# Patient Record
Sex: Female | Born: 1955 | Race: White | Hispanic: No | Marital: Married | Smoking: Never smoker
Health system: Southern US, Community
[De-identification: ages and names within clinical notes are randomized; demographics above are authoritative.]

## PROBLEM LIST (undated history)

## (undated) DIAGNOSIS — I10 Essential (primary) hypertension: Secondary | ICD-10-CM

## (undated) DIAGNOSIS — N842 Polyp of vagina: Secondary | ICD-10-CM

## (undated) DIAGNOSIS — T7840XA Allergy, unspecified, initial encounter: Secondary | ICD-10-CM

## (undated) DIAGNOSIS — B019 Varicella without complication: Secondary | ICD-10-CM

## (undated) DIAGNOSIS — K219 Gastro-esophageal reflux disease without esophagitis: Secondary | ICD-10-CM

## (undated) DIAGNOSIS — E785 Hyperlipidemia, unspecified: Secondary | ICD-10-CM

## (undated) HISTORY — DX: Varicella without complication: B01.9

## (undated) HISTORY — DX: Hyperlipidemia, unspecified: E78.5

## (undated) HISTORY — DX: Polyp of vagina: N84.2

## (undated) HISTORY — DX: Essential (primary) hypertension: I10

## (undated) HISTORY — DX: Gastro-esophageal reflux disease without esophagitis: K21.9

## (undated) HISTORY — DX: Allergy, unspecified, initial encounter: T78.40XA

## (undated) HISTORY — PX: INTRAUTERINE DEVICE INSERTION: SHX323

---

## 2004-06-28 ENCOUNTER — Ambulatory Visit: Payer: Self-pay | Admitting: Internal Medicine

## 2006-04-24 ENCOUNTER — Ambulatory Visit: Payer: Self-pay | Admitting: Internal Medicine

## 2007-05-15 ENCOUNTER — Ambulatory Visit: Payer: Self-pay | Admitting: Obstetrics and Gynecology

## 2008-06-23 ENCOUNTER — Ambulatory Visit: Payer: Self-pay | Admitting: Internal Medicine

## 2009-05-09 ENCOUNTER — Ambulatory Visit: Payer: Self-pay | Admitting: Gastroenterology

## 2009-07-05 ENCOUNTER — Ambulatory Visit: Payer: Self-pay | Admitting: Internal Medicine

## 2010-04-23 LAB — HM PAP SMEAR

## 2010-04-23 LAB — HM COLONOSCOPY

## 2010-04-23 LAB — HM MAMMOGRAPHY

## 2010-07-06 LAB — HM MAMMOGRAPHY: HM Mammogram: NORMAL

## 2010-08-02 ENCOUNTER — Ambulatory Visit: Payer: Self-pay | Admitting: Internal Medicine

## 2011-10-15 ENCOUNTER — Ambulatory Visit: Payer: Self-pay | Admitting: Internal Medicine

## 2011-11-05 ENCOUNTER — Ambulatory Visit (INDEPENDENT_AMBULATORY_CARE_PROVIDER_SITE_OTHER): Payer: BC Managed Care – PPO | Admitting: Internal Medicine

## 2011-11-05 ENCOUNTER — Encounter: Payer: Self-pay | Admitting: Internal Medicine

## 2011-11-05 VITALS — BP 140/90 | HR 76 | Temp 98.3°F | Ht 68.5 in | Wt 143.2 lb

## 2011-11-05 DIAGNOSIS — K219 Gastro-esophageal reflux disease without esophagitis: Secondary | ICD-10-CM

## 2011-11-05 DIAGNOSIS — E785 Hyperlipidemia, unspecified: Secondary | ICD-10-CM | POA: Insufficient documentation

## 2011-11-05 DIAGNOSIS — H9209 Otalgia, unspecified ear: Secondary | ICD-10-CM

## 2011-11-05 DIAGNOSIS — Z0000 Encounter for general adult medical examination without abnormal findings: Secondary | ICD-10-CM

## 2011-11-05 DIAGNOSIS — I10 Essential (primary) hypertension: Secondary | ICD-10-CM | POA: Insufficient documentation

## 2011-11-05 DIAGNOSIS — H9202 Otalgia, left ear: Secondary | ICD-10-CM | POA: Insufficient documentation

## 2011-11-05 DIAGNOSIS — Z23 Encounter for immunization: Secondary | ICD-10-CM

## 2011-11-05 NOTE — Progress Notes (Signed)
Subjective:    Patient ID: Natasha Pace, female    DOB: Jul 28, 1955, 56 y.o.   MRN: [id number]  HPI 56 year old female with history of hypertension and hyperlipidemia presents to establish care. Regards to her hypertension, she reports she was diagnosed over 10 years ago. She reports her blood pressure is generally been well controlled on metoprolol, however she doesn't regularly check her blood pressure.  She notes that she had elevated cholesterol on lab work performed approximately 6 months ago. However, this lab work was not performed fasting. She reports that her cholesterol has been elevated several times in the past. She tries to follow a healthy diet and exercises by walking approximately 3-4 miles per day.  She notes a history of GERD. She reports that recently her symptoms have been persistent despite the use of omeprazole daily. She has never had upper endoscopy. She has never been checked for H. pylori. She denies any bloody stools, nausea, vomiting, diarrhea. Symptoms consist of mostly upper epigastric pain.  She also notes an intermittent history of left ear pain. This was evaluated by ENT in the past and she was told this might be related to her acid reflux. She has not had any nasal congestion, fever, chills, hearing loss, dizziness. The pain is described as pressure which occurs intermittently lasting for a few days. Most recently occurred when she took a vacation to [place]. She did not use any medication for this.  Outpatient Encounter Prescriptions as of 11/05/2011  Medication Sig Dispense Refill  . metoprolol (LOPRESSOR) 50 MG tablet Take 50 mg by mouth daily.        Review of Systems  Constitutional: Negative for fever, chills, appetite change, fatigue and unexpected weight change.  HENT: Positive for ear pain. Negative for hearing loss, congestion, sore throat, trouble swallowing, neck pain, voice change, sinus pressure and ear discharge.   Eyes: Negative for visual  disturbance.  Respiratory: Negative for cough, shortness of breath, wheezing and stridor.   Cardiovascular: Negative for chest pain, palpitations and leg swelling.  Gastrointestinal: Positive for abdominal pain. Negative for nausea, vomiting, diarrhea, constipation, blood in stool, abdominal distention and anal bleeding.  Genitourinary: Negative for dysuria and flank pain.  Musculoskeletal: Negative for myalgias, arthralgias and gait problem.  Skin: Negative for color change and rash.  Neurological: Negative for dizziness and headaches.  Hematological: Negative for adenopathy. Does not bruise/bleed easily.  Psychiatric/Behavioral: Negative for suicidal ideas, disturbed wake/sleep cycle and dysphoric mood. The patient is not nervous/anxious.        Objective:   Physical Exam  Constitutional: She is oriented to person, place, and time. She appears well-developed and well-nourished. No distress.  HENT:  Head: Normocephalic and atraumatic.  Right Ear: External ear normal. Tympanic membrane is scarred.  Left Ear: External ear normal. Tympanic membrane is scarred.  Nose: Nose normal.  Mouth/Throat: Oropharynx is clear and moist. No oropharyngeal exudate.  Eyes: Conjunctivae are normal. Pupils are equal, round, and reactive to light. Right eye exhibits no discharge. Left eye exhibits no discharge. No scleral icterus.  Neck: Normal range of motion. Neck supple. No tracheal deviation present. No thyromegaly present.  Cardiovascular: Normal rate, regular rhythm, normal heart sounds and intact distal pulses.  Exam reveals no gallop and no friction rub.   No murmur heard. Pulmonary/Chest: Effort normal and breath sounds normal. No respiratory distress. She has no wheezes. She has no rales. She exhibits no tenderness.  Musculoskeletal: Normal range of motion. She exhibits no edema and no  tenderness.  Lymphadenopathy:    She has no cervical adenopathy.  Neurological: She is alert and oriented to  person, place, and time. No cranial nerve deficit. She exhibits normal muscle tone. Coordination normal.  Skin: Skin is warm and dry. No rash noted. She is not diaphoretic. No erythema. No pallor.  Psychiatric: She has a normal mood and affect. Her behavior is normal. Judgment and thought content normal.          Assessment & Plan:

## 2011-11-05 NOTE — Assessment & Plan Note (Signed)
Patient reports elevated lipids in the past. Will check lipids prior to physical exam in one month.

## 2011-11-05 NOTE — Assessment & Plan Note (Signed)
Blood pressure slightly elevated today. Will continue to monitor at home patient will e-mail or call with results. Will review previous evaluation and management. Question if patient ever had renal Dopplers given hypertension and young age. Followup one month.

## 2011-11-05 NOTE — Assessment & Plan Note (Signed)
Suspect related to increased middle ear pressure and poor equalization of pressure given scar tissue over the tympanic membrane. Discussed potential referral to ENT, but she would like to hold off for now. We'll continue to monitor.

## 2011-11-05 NOTE — Assessment & Plan Note (Signed)
Patient reports persistent reflux symptoms despite the use of omeprazole. Will try changing to Dexilant. We'll also check H. pylori with labs. Followup one month.

## 2011-11-07 ENCOUNTER — Encounter: Payer: Self-pay | Admitting: Internal Medicine

## 2011-11-07 ENCOUNTER — Other Ambulatory Visit (INDEPENDENT_AMBULATORY_CARE_PROVIDER_SITE_OTHER): Payer: BC Managed Care – PPO | Admitting: *Deleted

## 2011-11-07 DIAGNOSIS — Z0000 Encounter for general adult medical examination without abnormal findings: Secondary | ICD-10-CM

## 2011-11-07 DIAGNOSIS — K219 Gastro-esophageal reflux disease without esophagitis: Secondary | ICD-10-CM

## 2011-11-07 DIAGNOSIS — E785 Hyperlipidemia, unspecified: Secondary | ICD-10-CM

## 2011-11-07 LAB — LIPID PANEL: HDL: 58.3 mg/dL (ref >39.00)

## 2011-11-07 LAB — CBC WITH DIFFERENTIAL/PLATELET
Basophils Relative: 0.8 % (ref 0.0–3.0)
Eosinophils Relative: 4.5 % (ref 0.0–5.0)
MCV: 91.8 fl (ref 78.0–100.0)
Monocytes Absolute: 0.6 10*3/uL (ref 0.1–1.0)
Monocytes Relative: 9.5 % (ref 3.0–12.0)
Neutrophils Relative %: 58.2 % (ref 43.0–77.0)
RBC: 4.4 Mil/uL (ref 3.87–5.11)
WBC: 5.8 10*3/uL (ref 4.5–10.5)

## 2011-11-07 LAB — COMPREHENSIVE METABOLIC PANEL
ALT: 20 U/L (ref 0–35)
AST: 23 U/L (ref 0–37)
Albumin: 4.3 g/dL (ref 3.5–5.2)
Calcium: 9.4 mg/dL (ref 8.4–10.5)
Chloride: 102 mEq/L (ref 96–112)
Creatinine, Ser: 0.9 mg/dL (ref 0.4–1.2)
Potassium: 3.7 mEq/L (ref 3.5–5.1)

## 2011-11-07 LAB — LDL CHOLESTEROL, DIRECT: Direct LDL: 178.4 mg/dL

## 2011-11-08 MED ORDER — ATORVASTATIN CALCIUM 20 MG PO TABS: 20.0000 mg | ORAL_TABLET | Freq: Every day | ORAL | Status: DC

## 2011-11-09 LAB — HELICOBACTER PYLORI  ANTIBODY, IGM: Helicobacter pylori, IgM: 0.5 U/mL (ref <9.0)

## 2011-11-22 ENCOUNTER — Ambulatory Visit: Payer: Self-pay | Admitting: Internal Medicine

## 2011-11-23 ENCOUNTER — Encounter: Payer: Self-pay | Admitting: Internal Medicine

## 2011-11-24 MED ORDER — DEXLANSOPRAZOLE 60 MG PO CPDR: 60.0000 mg | DELAYED_RELEASE_CAPSULE | Freq: Every day | ORAL | Status: DC

## 2011-12-06 ENCOUNTER — Encounter: Payer: Self-pay | Admitting: Internal Medicine

## 2011-12-06 MED ORDER — METOPROLOL TARTRATE 50 MG PO TABS: 50.0000 mg | ORAL_TABLET | Freq: Every day | ORAL | Status: DC

## 2011-12-13 ENCOUNTER — Ambulatory Visit (INDEPENDENT_AMBULATORY_CARE_PROVIDER_SITE_OTHER): Payer: BC Managed Care – PPO | Admitting: Internal Medicine

## 2011-12-13 ENCOUNTER — Encounter: Payer: Self-pay | Admitting: Internal Medicine

## 2011-12-13 ENCOUNTER — Other Ambulatory Visit (HOSPITAL_COMMUNITY)
Admission: RE | Admit: 2011-12-13 | Discharge: 2011-12-13 | Disposition: A | Discharge status: Home / Self Care | Payer: BC Managed Care – PPO | Source: Ambulatory Visit | Attending: Internal Medicine | Admitting: Internal Medicine

## 2011-12-13 VITALS — BP 140/92 | HR 64 | Temp 98.7°F | Ht 68.5 in | Wt 141.0 lb

## 2011-12-13 DIAGNOSIS — K219 Gastro-esophageal reflux disease without esophagitis: Secondary | ICD-10-CM

## 2011-12-13 DIAGNOSIS — Z01419 Encounter for gynecological examination (general) (routine) without abnormal findings: Secondary | ICD-10-CM | POA: Insufficient documentation

## 2011-12-13 DIAGNOSIS — I10 Essential (primary) hypertension: Secondary | ICD-10-CM

## 2011-12-13 DIAGNOSIS — E785 Hyperlipidemia, unspecified: Secondary | ICD-10-CM

## 2011-12-13 DIAGNOSIS — Z0000 Encounter for general adult medical examination without abnormal findings: Secondary | ICD-10-CM

## 2011-12-13 MED ORDER — ESOMEPRAZOLE MAGNESIUM 40 MG PO CPDR: 40.0000 mg | DELAYED_RELEASE_CAPSULE | Freq: Every day | ORAL | Status: DC

## 2011-12-13 MED ORDER — METOPROLOL SUCCINATE ER 50 MG PO TB24: 50.0000 mg | ORAL_TABLET | Freq: Every day | ORAL | Status: DC

## 2011-12-13 NOTE — Assessment & Plan Note (Signed)
Patient recently started on atorvastatin given elevated cholesterol. Tolerating well. Will repeat liver function test and lipids today.

## 2011-12-13 NOTE — Assessment & Plan Note (Signed)
Symptoms improved with Dexilant, however insurance will not cover. Will try Nexium. Pt will call if symptoms not improved. If no improvement, favor repeat GI evaluation with upper endoscopy.

## 2011-12-13 NOTE — Assessment & Plan Note (Signed)
General medical exam including breast and pelvic exam normal today. Pap is pending. Patient is up-to-date on health maintenance. Encourage continued efforts at healthy diet and regular physical activity. Followup in 6 months.

## 2011-12-13 NOTE — Progress Notes (Signed)
Subjective:    Patient ID: Natasha Pace, female    DOB: 08/10/1955, 56 y.o.   MRN: [id number]  HPI 56 year old female with history of GERD presents for annual exam. At her last visit, she was started on Dexilant to help with acid reflux symptoms. She reports improvement with use of this medication, however her insurance company will not cover the medicine. She is back to using Prilosec but reports some breakthrough symptoms including epigastric pain. Testing for H. pylori was negative.  On her recent lab work, lipids were found to be elevated with an LDL of 178. We reviewed these labs today. We discussed goal LDL of less than 100. She was started on atorvastatin 20 mg a reports full compliance with this medication. She denies any noted side effects such as myalgia.    Outpatient Encounter Prescriptions as of 12/13/2011  Medication Sig Dispense Refill  . atorvastatin (LIPITOR) 20 MG tablet Take 1 tablet (20 mg total) by mouth daily.  60 tablet  3  . esomeprazole (NEXIUM) 40 MG capsule Take 1 capsule (40 mg total) by mouth daily.  30 capsule  3  . metoprolol succinate (TOPROL-XL) 50 MG 24 hr tablet Take 1 tablet (50 mg total) by mouth daily.  90 tablet  3   BP 140/92  Pulse 64  Temp 98.7 F (37.1 C) (Oral)  Ht 5' 8.5" (1.74 m)  Wt 141 lb (63.957 kg)  BMI 21.13 kg/m2  SpO2 96%  Review of Systems  Constitutional: Negative for fever, chills, appetite change, fatigue and unexpected weight change.  HENT: Negative for ear pain, congestion, sore throat, trouble swallowing, neck pain, voice change and sinus pressure.   Eyes: Negative for visual disturbance.  Respiratory: Negative for cough, shortness of breath, wheezing and stridor.   Cardiovascular: Negative for chest pain, palpitations and leg swelling.  Gastrointestinal: Negative for nausea, vomiting, abdominal pain, diarrhea, constipation, blood in stool, abdominal distention and anal bleeding.  Genitourinary: Negative for dysuria and  flank pain.  Musculoskeletal: Negative for myalgias, arthralgias and gait problem.  Skin: Negative for color change and rash.  Neurological: Negative for dizziness and headaches.  Hematological: Negative for adenopathy. Does not bruise/bleed easily.  Psychiatric/Behavioral: Negative for suicidal ideas, disturbed wake/sleep cycle and dysphoric mood. The patient is not nervous/anxious.        Objective:   Physical Exam  Constitutional: She is oriented to person, place, and time. She appears well-developed and well-nourished. No distress.  HENT:  Head: Normocephalic and atraumatic.  Right Ear: External ear normal.  Left Ear: External ear normal.  Nose: Nose normal.  Mouth/Throat: Oropharynx is clear and moist. No oropharyngeal exudate.  Eyes: Conjunctivae are normal. Pupils are equal, round, and reactive to light. Right eye exhibits no discharge. Left eye exhibits no discharge. No scleral icterus.  Neck: Normal range of motion. Neck supple. No tracheal deviation present. No thyromegaly present.  Cardiovascular: Normal rate, regular rhythm, normal heart sounds and intact distal pulses.  Exam reveals no gallop and no friction rub.   No murmur heard. Pulmonary/Chest: Effort normal and breath sounds normal. No respiratory distress. She has no wheezes. She has no rales. She exhibits no tenderness.  Abdominal: Soft. Bowel sounds are normal. She exhibits no distension and no mass. There is no tenderness. There is no rebound and no guarding.  Genitourinary: Rectum normal, vagina normal and uterus normal. No breast swelling, tenderness, discharge or bleeding. Pelvic exam was performed with patient supine. There is no rash, tenderness or lesion on  the right labia. There is no rash, tenderness or lesion on the left labia. Uterus is not enlarged and not tender. Cervix exhibits no motion tenderness, no discharge and no friability. Right adnexum displays no mass, no tenderness and no fullness. Left adnexum  displays no mass, no tenderness and no fullness. No erythema or tenderness around the vagina. No vaginal discharge found.  Musculoskeletal: Normal range of motion. She exhibits no edema and no tenderness.  Lymphadenopathy:    She has no cervical adenopathy.  Neurological: She is alert and oriented to person, place, and time. No cranial nerve deficit. She exhibits normal muscle tone. Coordination normal.  Skin: Skin is warm and dry. No rash noted. She is not diaphoretic. No erythema. No pallor.  Psychiatric: She has a normal mood and affect. Her behavior is normal. Judgment and thought content normal.          Assessment & Plan:

## 2011-12-13 NOTE — Assessment & Plan Note (Signed)
Blood pressure slightly elevated but patient reports better control at home. We'll continue Toprol. Patient will call if blood pressure consistently greater than 140/90. Followup 6 months.

## 2012-03-05 LAB — HM PAP SMEAR: HM PAP: NORMAL

## 2012-04-04 ENCOUNTER — Encounter: Payer: Self-pay | Admitting: Internal Medicine

## 2012-04-04 ENCOUNTER — Other Ambulatory Visit: Payer: Self-pay | Admitting: General Practice

## 2012-04-04 DIAGNOSIS — K219 Gastro-esophageal reflux disease without esophagitis: Secondary | ICD-10-CM

## 2012-04-04 MED ORDER — ESOMEPRAZOLE MAGNESIUM 40 MG PO CPDR: 40.0000 mg | DELAYED_RELEASE_CAPSULE | Freq: Every day | ORAL | Status: DC

## 2012-04-14 ENCOUNTER — Telehealth: Payer: Self-pay | Admitting: Internal Medicine

## 2012-04-14 NOTE — Telephone Encounter (Signed)
PA form received sent to Dr. Dan Humphreys to fill out.

## 2012-04-14 NOTE — Telephone Encounter (Signed)
Prior authorization for nexium dr 40 mg capsule  Qty: 30 each Sig: take one capsule by mouth daily I called to get the form faxed to me the number is [phone] The patient's ID is [id number] I spoke with [facility] she is going to fax form over to [place].

## 2012-04-15 NOTE — Telephone Encounter (Signed)
PA form faxed in insurance company, for the second time.

## 2012-04-21 ENCOUNTER — Other Ambulatory Visit: Payer: Self-pay | Admitting: General Practice

## 2012-04-21 DIAGNOSIS — K219 Gastro-esophageal reflux disease without esophagitis: Secondary | ICD-10-CM

## 2012-04-21 MED ORDER — ESOMEPRAZOLE MAGNESIUM 40 MG PO CPDR: 40.0000 mg | DELAYED_RELEASE_CAPSULE | Freq: Every day | ORAL | Status: DC

## 2012-04-21 NOTE — Telephone Encounter (Signed)
I called insurance company and spoke with Arline Asp she states that this has been approved for 1 year expiring on 04/15/13 the case # is [id number] for qty of 30.  She states that we need to contact [facility] and have them rerun the claim so that they can see patient has been approved.

## 2012-04-21 NOTE — Telephone Encounter (Signed)
Medication reordered on 12/30.

## 2012-04-23 DIAGNOSIS — N842 Polyp of vagina: Secondary | ICD-10-CM

## 2012-04-23 HISTORY — DX: Polyp of vagina: N84.2

## 2012-05-21 ENCOUNTER — Encounter: Payer: Self-pay | Admitting: Internal Medicine

## 2012-06-07 ENCOUNTER — Other Ambulatory Visit: Payer: Self-pay

## 2012-07-01 ENCOUNTER — Other Ambulatory Visit: Payer: Self-pay | Admitting: Internal Medicine

## 2012-07-03 NOTE — Telephone Encounter (Signed)
Eprescribed.

## 2012-07-06 ENCOUNTER — Other Ambulatory Visit: Payer: Self-pay | Admitting: Internal Medicine

## 2012-08-13 ENCOUNTER — Encounter: Payer: Self-pay | Admitting: Internal Medicine

## 2012-08-13 MED ORDER — ATORVASTATIN CALCIUM 20 MG PO TABS: 20.0000 mg | ORAL_TABLET | Freq: Every day | ORAL | Status: DC

## 2012-08-23 ENCOUNTER — Other Ambulatory Visit: Payer: Self-pay | Admitting: Internal Medicine

## 2012-12-22 ENCOUNTER — Other Ambulatory Visit: Payer: Self-pay | Admitting: Internal Medicine

## 2012-12-23 NOTE — Telephone Encounter (Signed)
Eprescribed.

## 2012-12-25 ENCOUNTER — Other Ambulatory Visit: Payer: Self-pay | Admitting: Internal Medicine

## 2013-02-10 ENCOUNTER — Ambulatory Visit (INDEPENDENT_AMBULATORY_CARE_PROVIDER_SITE_OTHER): Payer: BC Managed Care – PPO | Admitting: Internal Medicine

## 2013-02-10 ENCOUNTER — Encounter: Payer: Self-pay | Admitting: Internal Medicine

## 2013-02-10 VITALS — BP 128/90 | HR 62 | Temp 98.3°F | Ht 68.5 in | Wt 149.0 lb

## 2013-02-10 DIAGNOSIS — K219 Gastro-esophageal reflux disease without esophagitis: Secondary | ICD-10-CM

## 2013-02-10 DIAGNOSIS — N952 Postmenopausal atrophic vaginitis: Secondary | ICD-10-CM

## 2013-02-10 DIAGNOSIS — E785 Hyperlipidemia, unspecified: Secondary | ICD-10-CM

## 2013-02-10 DIAGNOSIS — I10 Essential (primary) hypertension: Secondary | ICD-10-CM

## 2013-02-10 DIAGNOSIS — Z0000 Encounter for general adult medical examination without abnormal findings: Secondary | ICD-10-CM

## 2013-02-10 MED ORDER — METOPROLOL SUCCINATE ER 50 MG PO TB24: 50.0000 mg | ORAL_TABLET | Freq: Every day | ORAL | Status: DC

## 2013-02-10 MED ORDER — ESTROGENS, CONJUGATED 0.625 MG/GM VA CREA: TOPICAL_CREAM | VAGINAL | Status: DC

## 2013-02-10 MED ORDER — ESOMEPRAZOLE MAGNESIUM 40 MG PO CPDR: 40.0000 mg | DELAYED_RELEASE_CAPSULE | Freq: Every day | ORAL | Status: DC

## 2013-02-10 NOTE — Assessment & Plan Note (Signed)
General medical exam including breast exam normal today. PAP and pelvic deferred as normal 2013. Mammogram ordered. Colonoscopy UTD. Will check labs including CBC, CMP, lipids, TSH, Vit D. Encouraged healthy diet and regular physical activity.

## 2013-02-10 NOTE — Assessment & Plan Note (Signed)
Will check lipids and LFTs with labs. 

## 2013-02-10 NOTE — Assessment & Plan Note (Signed)
Symptoms well controlled with Nexium. Will continue. 

## 2013-02-10 NOTE — Assessment & Plan Note (Signed)
BP Readings from Last 3 Encounters:  02/10/13 128/90  12/13/11 140/92  11/05/11 140/90   BP generally well controlled on Metoprolol. Will continue.

## 2013-02-10 NOTE — Progress Notes (Signed)
Subjective:    Patient ID: Natasha Pace, female    DOB: Jan 04, 1956, 57 y.o.   MRN: [id number]  HPI 57 year old female with history of hypertension, hyperlipidemia presents for annual exam. She reports she is generally feeling well. She notes that, earlier this year, she had her IUD removed. At that time, her GYN noticed that she had a vaginal polyp. This was removed and biopsied. This showed benign tissue.  She is concerned today about recent increase in vaginal dryness. She notes some discomfort with intercourse. She has never taken medications for this. She has been researching about topical estrogen preparations would like to consider this.  Outpatient Encounter Prescriptions as of 02/10/2013  Medication Sig Dispense Refill  . atorvastatin (LIPITOR) 20 MG tablet Take 1 tablet (20 mg total) by mouth daily.  90 tablet  3  . esomeprazole (NEXIUM) 40 MG capsule Take 1 capsule (40 mg total) by mouth daily.  90 capsule  3  . loratadine (CLARITIN) 10 MG tablet Take 10 mg by mouth daily.      . metoprolol succinate (TOPROL-XL) 50 MG 24 hr tablet Take 1 tablet (50 mg total) by mouth daily.  90 tablet  3   No facility-administered encounter medications on file as of 02/10/2013.   BP 128/90  Pulse 62  Temp(Src) 98.3 F (36.8 C) (Oral)  Ht 5' 8.5" (1.74 m)  Wt 149 lb (67.586 kg)  BMI 22.32 kg/m2  SpO2 99%  Review of Systems  Constitutional: Negative for fever, chills, appetite change, fatigue and unexpected weight change.  HENT: Negative for congestion, ear pain, sinus pressure, sore throat, trouble swallowing and voice change.   Eyes: Negative for visual disturbance.  Respiratory: Negative for cough, shortness of breath, wheezing and stridor.   Cardiovascular: Negative for chest pain, palpitations and leg swelling.  Gastrointestinal: Negative for nausea, vomiting, abdominal pain, diarrhea, constipation, blood in stool, abdominal distention and anal bleeding.  Genitourinary: Positive for  vaginal pain (dryness) and dyspareunia. Negative for dysuria, flank pain, vaginal bleeding and vaginal discharge.  Musculoskeletal: Negative for arthralgias, gait problem, myalgias and neck pain.  Skin: Negative for color change and rash.  Neurological: Negative for dizziness and headaches.  Hematological: Negative for adenopathy. Does not bruise/bleed easily.  Psychiatric/Behavioral: Negative for suicidal ideas, sleep disturbance and dysphoric mood. The patient is not nervous/anxious.        Objective:   Physical Exam  Constitutional: She is oriented to person, place, and time. She appears well-developed and well-nourished. No distress.  HENT:  Head: Normocephalic and atraumatic.  Right Ear: External ear normal.  Left Ear: External ear normal.  Nose: Nose normal.  Mouth/Throat: Oropharynx is clear and moist. No oropharyngeal exudate.  Eyes: Conjunctivae are normal. Pupils are equal, round, and reactive to light. Right eye exhibits no discharge. Left eye exhibits no discharge. No scleral icterus.  Neck: Normal range of motion. Neck supple. No tracheal deviation present. No thyromegaly present.  Cardiovascular: Normal rate, regular rhythm, normal heart sounds and intact distal pulses.  Exam reveals no gallop and no friction rub.   No murmur heard. Pulmonary/Chest: Effort normal and breath sounds normal. No accessory muscle usage. Not tachypneic. No respiratory distress. She has no decreased breath sounds. She has no wheezes. She has no rales. She exhibits no tenderness. Right breast exhibits no inverted nipple, no mass, no nipple discharge, no skin change and no tenderness. Left breast exhibits no inverted nipple, no mass, no nipple discharge, no skin change and no tenderness. Breasts are  symmetrical.  Abdominal: Soft. Bowel sounds are normal. She exhibits no distension and no mass. There is no tenderness. There is no rebound and no guarding.  Musculoskeletal: Normal range of motion. She  exhibits no edema and no tenderness.  Lymphadenopathy:    She has no cervical adenopathy.  Neurological: She is alert and oriented to person, place, and time. No cranial nerve deficit. She exhibits normal muscle tone. Coordination normal.  Skin: Skin is warm and dry. No rash noted. She is not diaphoretic. No erythema. No pallor.  Psychiatric: She has a normal mood and affect. Her behavior is normal. Judgment and thought content normal.          Assessment & Plan:

## 2013-02-10 NOTE — Assessment & Plan Note (Signed)
Vaginal dryness and dyspareunia. Discussed options for treatment including topical estrogen. Discussed potential risks of these medications. Will start Premarin topically daily x 2 weeks, then 1-2 times weekly. Follow up prn.

## 2013-02-11 LAB — CBC WITH DIFFERENTIAL/PLATELET
Basophils Absolute: 0.1 10*3/uL (ref 0.0–0.1)
Basophils Relative: 1.7 % (ref 0.0–3.0)
Eosinophils Relative: 3 % (ref 0.0–5.0)
HCT: 38.2 % (ref 36.0–46.0)
Hemoglobin: 13.1 g/dL (ref 12.0–15.0)
Lymphocytes Relative: 36.5 % (ref 12.0–46.0)
Lymphs Abs: 2.2 10*3/uL (ref 0.7–4.0)
MCV: 89.3 fl (ref 78.0–100.0)
Monocytes Relative: 7.8 % (ref 3.0–12.0)
Neutro Abs: 3.1 10*3/uL (ref 1.4–7.7)
Platelets: 228 10*3/uL (ref 150.0–400.0)
RBC: 4.28 Mil/uL (ref 3.87–5.11)
WBC: 6 10*3/uL (ref 4.5–10.5)

## 2013-02-11 LAB — COMPREHENSIVE METABOLIC PANEL
ALT: 30 U/L (ref 0–35)
Albumin: 4.3 g/dL (ref 3.5–5.2)
CO2: 30 mEq/L (ref 19–32)
Calcium: 9.6 mg/dL (ref 8.4–10.5)
Chloride: 102 mEq/L (ref 96–112)
GFR: 79.11 mL/min (ref >60.00)
Glucose, Bld: 91 mg/dL (ref 70–99)
Potassium: 4.2 mEq/L (ref 3.5–5.1)
Sodium: 139 mEq/L (ref 135–145)
Total Bilirubin: 0.9 mg/dL (ref 0.3–1.2)
Total Protein: 7.6 g/dL (ref 6.0–8.3)

## 2013-02-11 LAB — LIPID PANEL
Cholesterol: 182 mg/dL (ref 0–200)
LDL Cholesterol: 93 mg/dL (ref 0–99)
Total CHOL/HDL Ratio: 3

## 2013-02-11 LAB — TSH: TSH: 0.92 u[IU]/mL (ref 0.35–5.50)

## 2013-03-10 ENCOUNTER — Ambulatory Visit: Payer: Self-pay | Admitting: Internal Medicine

## 2013-03-10 LAB — HM MAMMOGRAPHY

## 2013-03-30 ENCOUNTER — Encounter: Payer: Self-pay | Admitting: Internal Medicine

## 2013-05-28 ENCOUNTER — Telehealth: Payer: Self-pay | Admitting: *Deleted

## 2013-05-28 NOTE — Telephone Encounter (Signed)
Received PA request form for the Nexium, placed in Dr.walkers box

## 2013-06-01 NOTE — Telephone Encounter (Signed)
Left message for patient to return call. Dr. Dan HumphreysWalker would like to know what other medications she has tried other than Nexium. Need this information for completion of PA

## 2013-06-03 NOTE — Telephone Encounter (Signed)
Left message to call back  

## 2013-06-08 ENCOUNTER — Ambulatory Visit: Payer: BC Managed Care – PPO | Admitting: Adult Health

## 2013-06-08 NOTE — Telephone Encounter (Signed)
Spoke with patient, she stated she has tried Omeprazole in the past but the Nexium works better. However since Nexium is available OTC then she will just pay for it herself. Since insurance company is trying to deny it then it is easier for her to buy it OTC.

## 2013-08-18 ENCOUNTER — Other Ambulatory Visit: Payer: Self-pay | Admitting: Internal Medicine

## 2014-02-11 ENCOUNTER — Other Ambulatory Visit: Payer: Self-pay | Admitting: Internal Medicine

## 2014-02-26 ENCOUNTER — Other Ambulatory Visit: Payer: Self-pay | Admitting: Internal Medicine

## 2014-03-05 ENCOUNTER — Ambulatory Visit (INDEPENDENT_AMBULATORY_CARE_PROVIDER_SITE_OTHER): Payer: BC Managed Care – PPO | Admitting: Internal Medicine

## 2014-03-05 ENCOUNTER — Encounter: Payer: Self-pay | Admitting: Internal Medicine

## 2014-03-05 VITALS — BP 130/78 | HR 65 | Temp 98.1°F | Ht 68.5 in | Wt 149.2 lb

## 2014-03-05 DIAGNOSIS — Z0000 Encounter for general adult medical examination without abnormal findings: Secondary | ICD-10-CM

## 2014-03-05 MED ORDER — PANTOPRAZOLE SODIUM 40 MG PO TBEC: 40.0000 mg | DELAYED_RELEASE_TABLET | Freq: Every day | ORAL | Status: DC

## 2014-03-05 NOTE — Patient Instructions (Signed)

## 2014-03-05 NOTE — Progress Notes (Signed)
Pre visit review using our clinic review tool, if applicable. No additional management support is needed unless otherwise documented below in the visit note. 

## 2014-03-05 NOTE — Assessment & Plan Note (Signed)
General medical exam normal including breast exam. PAP and pelvic deferred given PAP normal 2013. Mammogram ordered. Colonoscopy UTD. Flu vaccine complete. Will check labs including CBC, CMP, lipids, TSH. Follow up in 1 year and prn.

## 2014-03-05 NOTE — Progress Notes (Signed)
Subjective:    Patient ID: Natasha Pace, female    DOB: [birth year], 58 y.o.   MRN: [id number]  HPI 58YO female presents for annual exam. Feeling well with no concerns today. Recently had pulled muscle in her left shoulder. Improved with taking ibuprofen. Follows healthy diet and exercises by walking about 4-745miles daily and doing yoga twice per week. Had flu shot this year Due for mammogram.  Review of Systems  Constitutional: Negative for fever, chills, appetite change, fatigue and unexpected weight change.  Eyes: Negative for visual disturbance.  Respiratory: Negative for shortness of breath.   Cardiovascular: Negative for chest pain and leg swelling.  Gastrointestinal: Negative for nausea, vomiting, abdominal pain, diarrhea and constipation.  Musculoskeletal: Positive for myalgias. Negative for arthralgias.  Skin: Negative for color change and rash.  Hematological: Negative for adenopathy. Does not bruise/bleed easily.  Psychiatric/Behavioral: Negative for dysphoric mood. The patient is not nervous/anxious.        Objective:    BP 130/78 mmHg  Pulse 65  Temp(Src) 98.1 F (36.7 C) (Oral)  Ht 5' 8.5" (1.74 m)  Wt 149 lb 4 oz (67.699 kg)  BMI 22.36 kg/m2  SpO2 94% Physical Exam  Constitutional: She is oriented to person, place, and time. She appears well-developed and well-nourished. No distress.  HENT:  Head: Normocephalic and atraumatic.  Right Ear: External ear normal.  Left Ear: External ear normal.  Nose: Nose normal.  Mouth/Throat: Oropharynx is clear and moist. No oropharyngeal exudate.  Eyes: Conjunctivae are normal. Pupils are equal, round, and reactive to light. Right eye exhibits no discharge. Left eye exhibits no discharge. No scleral icterus.  Neck: Normal range of motion. Neck supple. No tracheal deviation present. No thyromegaly present.  Cardiovascular: Normal rate, regular rhythm, normal heart sounds and intact distal pulses.  Exam reveals no gallop  and no friction rub.   No murmur heard. Pulmonary/Chest: Effort normal and breath sounds normal. No accessory muscle usage. No tachypnea. No respiratory distress. She has no decreased breath sounds. She has no wheezes. She has no rales. She exhibits no tenderness. Right breast exhibits no inverted nipple, no mass, no nipple discharge, no skin change and no tenderness. Left breast exhibits no inverted nipple, no mass, no nipple discharge, no skin change and no tenderness. Breasts are symmetrical.  Abdominal: Soft. Bowel sounds are normal. She exhibits no distension and no mass. There is no tenderness. There is no rebound and no guarding.  Musculoskeletal: Normal range of motion. She exhibits no edema or tenderness.  Lymphadenopathy:    She has no cervical adenopathy.  Neurological: She is alert and oriented to person, place, and time. No cranial nerve deficit. She exhibits normal muscle tone. Coordination normal.  Skin: Skin is warm and dry. No rash noted. She is not diaphoretic. No erythema. No pallor.  Psychiatric: She has a normal mood and affect. Her behavior is normal. Judgment and thought content normal.          Assessment & Plan:   Problem List Items Addressed This Visit      Unprioritized   Routine general medical examination at a health care facility - Primary    General medical exam normal including breast exam. PAP and pelvic deferred given PAP normal 2013. Mammogram ordered. Colonoscopy UTD. Flu vaccine complete. Will check labs including CBC, CMP, lipids, TSH. Follow up in 1 year and prn.    Relevant Orders      MM Digital Screening      CBC  with Differential      Comprehensive metabolic panel      Lipid panel      Vit D  25 hydroxy (rtn osteoporosis monitoring)      TSH       Return in about 1 year (around 03/06/2015) for Physical.

## 2014-03-06 LAB — VITAMIN D 25 HYDROXY (VIT D DEFICIENCY, FRACTURES): Vit D, 25-Hydroxy: 44 ng/mL (ref 30–89)

## 2014-03-06 LAB — LIPID PANEL
CHOLESTEROL: 205 mg/dL — AB (ref 0–200)
HDL: 59 mg/dL (ref >39)
LDL CALC: 98 mg/dL (ref 0–99)
TRIGLYCERIDES: 240 mg/dL — AB (ref <150)
Total CHOL/HDL Ratio: 3.5 Ratio
VLDL: 48 mg/dL — ABNORMAL HIGH (ref 0–40)

## 2014-03-06 LAB — COMPREHENSIVE METABOLIC PANEL
ALK PHOS: 73 U/L (ref 39–117)
ALT: 33 U/L (ref 0–35)
AST: 29 U/L (ref 0–37)
Albumin: 4.6 g/dL (ref 3.5–5.2)
BILIRUBIN TOTAL: 0.7 mg/dL (ref 0.2–1.2)
BUN: 16 mg/dL (ref 6–23)
CO2: 29 meq/L (ref 19–32)
Calcium: 9.6 mg/dL (ref 8.4–10.5)
Chloride: 102 mEq/L (ref 96–112)
Creat: 0.79 mg/dL (ref 0.50–1.10)
GLUCOSE: 82 mg/dL (ref 70–99)
Potassium: 4.1 mEq/L (ref 3.5–5.3)
Sodium: 140 mEq/L (ref 135–145)
TOTAL PROTEIN: 7.9 g/dL (ref 6.0–8.3)

## 2014-03-06 LAB — CBC WITH DIFFERENTIAL/PLATELET
BASOS PCT: 0 % (ref 0–1)
Basophils Absolute: 0 10*3/uL (ref 0.0–0.1)
EOS ABS: 0.3 10*3/uL (ref 0.0–0.7)
Eosinophils Relative: 4 % (ref 0–5)
HEMATOCRIT: 40.7 % (ref 36.0–46.0)
HEMOGLOBIN: 14.3 g/dL (ref 12.0–15.0)
Lymphocytes Relative: 33 % (ref 12–46)
Lymphs Abs: 2.4 10*3/uL (ref 0.7–4.0)
MCH: 30.1 pg (ref 26.0–34.0)
MCHC: 35.1 g/dL (ref 30.0–36.0)
MCV: 85.7 fL (ref 78.0–100.0)
MONO ABS: 0.7 10*3/uL (ref 0.1–1.0)
MONOS PCT: 9 % (ref 3–12)
Neutro Abs: 4 10*3/uL (ref 1.7–7.7)
Neutrophils Relative %: 54 % (ref 43–77)
Platelets: 262 10*3/uL (ref 150–400)
RBC: 4.75 MIL/uL (ref 3.87–5.11)
RDW: 14.1 % (ref 11.5–15.5)
WBC: 7.4 10*3/uL (ref 4.0–10.5)

## 2014-03-06 LAB — TSH: TSH: 1.136 u[IU]/mL (ref 0.350–4.500)

## 2014-04-05 ENCOUNTER — Encounter: Payer: Self-pay | Admitting: Internal Medicine

## 2014-04-19 ENCOUNTER — Ambulatory Visit: Payer: Self-pay | Admitting: Internal Medicine

## 2014-04-19 LAB — HM MAMMOGRAPHY: HM Mammogram: NEGATIVE

## 2014-06-21 ENCOUNTER — Telehealth: Payer: Self-pay

## 2014-06-21 NOTE — Telephone Encounter (Signed)
PA request for Pantoprazole has been submitted through cover my meds. Awaiting response at this time.

## 2014-08-07 ENCOUNTER — Other Ambulatory Visit: Payer: Self-pay | Admitting: Internal Medicine

## 2014-09-15 ENCOUNTER — Other Ambulatory Visit: Payer: Self-pay | Admitting: Internal Medicine

## 2015-01-26 ENCOUNTER — Ambulatory Visit (INDEPENDENT_AMBULATORY_CARE_PROVIDER_SITE_OTHER): Payer: BC Managed Care – PPO | Admitting: Family Medicine

## 2015-01-26 ENCOUNTER — Encounter: Payer: Self-pay | Admitting: Family Medicine

## 2015-01-26 VITALS — BP 140/96 | HR 67 | Temp 97.7°F | Ht 68.5 in | Wt 150.4 lb

## 2015-01-26 DIAGNOSIS — M6248 Contracture of muscle, other site: Secondary | ICD-10-CM | POA: Diagnosis not present

## 2015-01-26 DIAGNOSIS — M62838 Other muscle spasm: Secondary | ICD-10-CM

## 2015-01-26 MED ORDER — CYCLOBENZAPRINE HCL 5 MG PO TABS: 5.0000 mg | ORAL_TABLET | Freq: Three times a day (TID) | ORAL | Status: DC | PRN

## 2015-01-26 NOTE — Assessment & Plan Note (Signed)
Exam consistent with trapezius spasm. Treating with flexeril PRN. Also advised massage.

## 2015-01-26 NOTE — Progress Notes (Signed)
Pre visit review using our clinic review tool, if applicable. No additional management support is needed unless otherwise documented below in the visit note. 

## 2015-01-26 NOTE — Patient Instructions (Signed)
It was nice to see you today.   Take the flexeril as needed (start at night).  I also recommend massage.  Follow up with Dr. Dan Humphreys   Take care  Dr. Adriana Simas

## 2015-01-26 NOTE — Progress Notes (Signed)
   Subjective:  Patient ID: Natasha Pace, female    DOB: Oct 12, 1955  Age: 59 y.o. MRN: [id number]  CC: Neck/shoulder pain  HPI:  59 year old female presents to the clinic today with complaints of neck/shoulder pain.  Patient reports she has had pain in her left neck/shoulder region intermittently for the past year. She describes the pain as achy and throbbing. She reports it is intermittently severe (6-7/10). She's been taking Advil regularly (800 mg Q6) which does improve her pain.  Exacerbating factors: Looking down at work.  No associated numbness or tingling in the hands. No other complaints at this time.  Social Hx   Social History   Social History  . Marital Status: Married    Spouse Name: N/A  . Number of Children: N/A  . Years of Education: N/A   Social History Main Topics  . Smoking status: Never Smoker   . Smokeless tobacco: None  . Alcohol Use: None  . Drug Use: None  . Sexual Activity: Not Asked   Other Topics Concern  . None   Social History Narrative   Lives in [place] with husband. 2 girls in college.      Work - Surveyor, mining at [employer]   Diet - healthy   Exercise - walks 3-4 miles per day   Review of Systems  Constitutional: Negative.   Musculoskeletal: Positive for myalgias and neck pain.   Objective:  BP 140/96 mmHg  Pulse 67  Temp(Src) 97.7 F (36.5 C) (Oral)  Ht 5' 8.5" (1.74 m)  Wt 150 lb 6 oz (68.21 kg)  BMI 22.53 kg/m2  SpO2 96%  BP/Weight 01/26/2015 03/05/2014 02/10/2013  Systolic BP 140 130 128  Diastolic BP 96 78 90  Wt. (Lbs) 150.38 149.25 149  BMI 22.53 22.36 22.32   Physical Exam  Constitutional: She is oriented to person, place, and time. She appears well-developed and well-nourished. No distress.  Musculoskeletal:  Neck: Normal ROM. Negative Spurlings. Shoulder: Inspection reveals no abnormalities, atrophy or asymmetry. Palpation is normal with no tenderness over AC joint or bicipital groove. ROM is full in all  planes. Rotator cuff strength normal throughout. No signs of impingement Left Trapezius muscle tender to palpation; tense/ropy.   Neurological: She is alert and oriented to person, place, and time.  Skin: Skin is warm and dry. No rash noted.  Psychiatric: She has a normal mood and affect.   Assessment & Plan:   Problem List Items Addressed This Visit    Trapezius muscle spasm - Primary     Meds ordered this encounter  Medications  . cyclobenzaprine (FLEXERIL) 5 MG tablet    Sig: Take 1 tablet (5 mg total) by mouth 3 (three) times daily as needed for muscle spasms.    Dispense:  30 tablet    Refill:  1    Follow-up: PRN  Everlene Other, DO

## 2015-02-04 ENCOUNTER — Other Ambulatory Visit: Payer: Self-pay | Admitting: Internal Medicine

## 2015-02-23 ENCOUNTER — Other Ambulatory Visit: Payer: Self-pay | Admitting: Internal Medicine

## 2015-03-21 ENCOUNTER — Ambulatory Visit: Payer: BC Managed Care – PPO | Admitting: Internal Medicine

## 2015-04-11 ENCOUNTER — Other Ambulatory Visit (HOSPITAL_COMMUNITY)
Admission: RE | Admit: 2015-04-11 | Discharge: 2015-04-11 | Disposition: A | Discharge status: Home / Self Care | Payer: BC Managed Care – PPO | Source: Ambulatory Visit | Attending: Internal Medicine | Admitting: Internal Medicine

## 2015-04-11 ENCOUNTER — Encounter: Payer: Self-pay | Admitting: *Deleted

## 2015-04-11 ENCOUNTER — Encounter: Payer: Self-pay | Admitting: Internal Medicine

## 2015-04-11 ENCOUNTER — Ambulatory Visit (INDEPENDENT_AMBULATORY_CARE_PROVIDER_SITE_OTHER): Payer: BC Managed Care – PPO | Admitting: Internal Medicine

## 2015-04-11 VITALS — BP 118/80 | HR 73 | Temp 98.5°F | Resp 18 | Ht 68.0 in | Wt 147.5 lb

## 2015-04-11 DIAGNOSIS — Z0000 Encounter for general adult medical examination without abnormal findings: Secondary | ICD-10-CM | POA: Diagnosis not present

## 2015-04-11 DIAGNOSIS — Z1239 Encounter for other screening for malignant neoplasm of breast: Secondary | ICD-10-CM

## 2015-04-11 DIAGNOSIS — R7989 Other specified abnormal findings of blood chemistry: Secondary | ICD-10-CM

## 2015-04-11 DIAGNOSIS — Z23 Encounter for immunization: Secondary | ICD-10-CM

## 2015-04-11 DIAGNOSIS — Z124 Encounter for screening for malignant neoplasm of cervix: Secondary | ICD-10-CM

## 2015-04-11 DIAGNOSIS — Z1151 Encounter for screening for human papillomavirus (HPV): Secondary | ICD-10-CM | POA: Diagnosis present

## 2015-04-11 DIAGNOSIS — Z01419 Encounter for gynecological examination (general) (routine) without abnormal findings: Secondary | ICD-10-CM | POA: Diagnosis present

## 2015-04-11 LAB — CBC WITH DIFFERENTIAL/PLATELET
BASOS ABS: 0 10*3/uL (ref 0.0–0.1)
Basophils Relative: 0.6 % (ref 0.0–3.0)
EOS PCT: 2.1 % (ref 0.0–5.0)
Eosinophils Absolute: 0.1 10*3/uL (ref 0.0–0.7)
HCT: 42.1 % (ref 36.0–46.0)
HEMOGLOBIN: 14 g/dL (ref 12.0–15.0)
LYMPHS ABS: 2.1 10*3/uL (ref 0.7–4.0)
Lymphocytes Relative: 34.7 % (ref 12.0–46.0)
MCHC: 33.2 g/dL (ref 30.0–36.0)
MCV: 89.4 fl (ref 78.0–100.0)
Monocytes Absolute: 0.5 10*3/uL (ref 0.1–1.0)
Monocytes Relative: 8.9 % (ref 3.0–12.0)
NEUTROS PCT: 53.7 % (ref 43.0–77.0)
Neutro Abs: 3.3 10*3/uL (ref 1.4–7.7)
Platelets: 256 10*3/uL (ref 150.0–400.0)
RBC: 4.71 Mil/uL (ref 3.87–5.11)
RDW: 14.1 % (ref 11.5–15.5)
WBC: 6.1 10*3/uL (ref 4.0–10.5)

## 2015-04-11 LAB — VITAMIN D 25 HYDROXY (VIT D DEFICIENCY, FRACTURES): VITD: 24.75 ng/mL — ABNORMAL LOW (ref 30.00–100.00)

## 2015-04-11 LAB — LIPID PANEL
Cholesterol: 200 mg/dL (ref 0–200)
HDL: 54.1 mg/dL
NonHDL: 146.06
Total CHOL/HDL Ratio: 4
Triglycerides: 210 mg/dL — ABNORMAL HIGH (ref 0.0–149.0)
VLDL: 42 mg/dL — ABNORMAL HIGH (ref 0.0–40.0)

## 2015-04-11 LAB — COMPREHENSIVE METABOLIC PANEL
ALBUMIN: 4.4 g/dL (ref 3.5–5.2)
ALK PHOS: 65 U/L (ref 39–117)
ALT: 26 U/L (ref 0–35)
AST: 26 U/L (ref 0–37)
BILIRUBIN TOTAL: 0.9 mg/dL (ref 0.2–1.2)
BUN: 16 mg/dL (ref 6–23)
CO2: 29 mEq/L (ref 19–32)
CREATININE: 0.77 mg/dL (ref 0.40–1.20)
Calcium: 9.6 mg/dL (ref 8.4–10.5)
Chloride: 102 mEq/L (ref 96–112)
GFR: 82.03 mL/min (ref >60.00)
GLUCOSE: 75 mg/dL (ref 70–99)
Potassium: 4.5 mEq/L (ref 3.5–5.1)
SODIUM: 139 meq/L (ref 135–145)
Total Protein: 7.4 g/dL (ref 6.0–8.3)

## 2015-04-11 LAB — LDL CHOLESTEROL, DIRECT: Direct LDL: 120 mg/dL

## 2015-04-11 LAB — TSH: TSH: 1.45 u[IU]/mL (ref 0.35–4.50)

## 2015-04-11 NOTE — Addendum Note (Signed)
Addended by: Warden FillersWRIGHT, Jihaad Bruschi S on: 04/11/2015 11:36 AM   Modules accepted: Orders, SmartSet

## 2015-04-11 NOTE — Patient Instructions (Signed)
Health Maintenance, Female Adopting a healthy lifestyle and getting preventive care can go a long way to promote health and wellness. Talk with your health care provider about what schedule of regular examinations is right for you. This is a good chance for you to check in with your provider about disease prevention and staying healthy. In between checkups, there are plenty of things you can do on your own. Experts have done a lot of research about which lifestyle changes and preventive measures are most likely to keep you healthy. Ask your health care provider for more information. WEIGHT AND DIET  Eat a healthy diet  Be sure to include plenty of vegetables, fruits, low-fat dairy products, and lean protein.  Do not eat a lot of foods high in solid fats, added sugars, or salt.  Get regular exercise. This is one of the most important things you can do for your health.  Most adults should exercise for at least 150 minutes each week. The exercise should increase your heart rate and make you sweat (moderate-intensity exercise).  Most adults should also do strengthening exercises at least twice a week. This is in addition to the moderate-intensity exercise.  Maintain a healthy weight  Body mass index (BMI) is a measurement that can be used to identify possible weight problems. It estimates body fat based on height and weight. Your health care provider can help determine your BMI and help you achieve or maintain a healthy weight.  For females 20 years of age and older:   A BMI below 18.5 is considered underweight.  A BMI of 18.5 to 24.9 is normal.  A BMI of 25 to 29.9 is considered overweight.  A BMI of 30 and above is considered obese.  Watch levels of cholesterol and blood lipids  You should start having your blood tested for lipids and cholesterol at 57 years of age, then have this test every 5 years.  You may need to have your cholesterol levels checked more often if:  Your lipid  or cholesterol levels are high.  You are older than 57 years of age.  You are at high risk for heart disease.  CANCER SCREENING   Lung Cancer  Lung cancer screening is recommended for adults 55-80 years old who are at high risk for lung cancer because of a history of smoking.  A yearly low-dose CT scan of the lungs is recommended for people who:  Currently smoke.  Have quit within the past 15 years.  Have at least a 30-pack-year history of smoking. A pack year is smoking an average of one pack of cigarettes a day for 1 year.  Yearly screening should continue until it has been 15 years since you quit.  Yearly screening should stop if you develop a health problem that would prevent you from having lung cancer treatment.  Breast Cancer  Practice breast self-awareness. This means understanding how your breasts normally appear and feel.  It also means doing regular breast self-exams. Let your health care provider know about any changes, no matter how small.  If you are in your 20s or 30s, you should have a clinical breast exam (CBE) by a health care provider every 1-3 years as part of a regular health exam.  If you are 40 or older, have a CBE every year. Also consider having a breast X-ray (mammogram) every year.  If you have a family history of breast cancer, talk to your health care provider about genetic screening.  If you   are at high risk for breast cancer, talk to your health care provider about having an MRI and a mammogram every year.  Breast cancer gene (BRCA) assessment is recommended for women who have family members with BRCA-related cancers. BRCA-related cancers include:  Breast.  Ovarian.  Tubal.  Peritoneal cancers.  Results of the assessment will determine the need for genetic counseling and BRCA1 and BRCA2 testing. Cervical Cancer Your health care provider may recommend that you be screened regularly for cancer of the pelvic organs (ovaries, uterus, and  vagina). This screening involves a pelvic examination, including checking for microscopic changes to the surface of your cervix (Pap test). You may be encouraged to have this screening done every 3 years, beginning at age 21.  For women ages 30-65, health care providers may recommend pelvic exams and Pap testing every 3 years, or they may recommend the Pap and pelvic exam, combined with testing for human papilloma virus (HPV), every 5 years. Some types of HPV increase your risk of cervical cancer. Testing for HPV may also be done on women of any age with unclear Pap test results.  Other health care providers may not recommend any screening for nonpregnant women who are considered low risk for pelvic cancer and who do not have symptoms. Ask your health care provider if a screening pelvic exam is right for you.  If you have had past treatment for cervical cancer or a condition that could lead to cancer, you need Pap tests and screening for cancer for at least 20 years after your treatment. If Pap tests have been discontinued, your risk factors (such as having a new sexual partner) need to be reassessed to determine if screening should resume. Some women have medical problems that increase the chance of getting cervical cancer. In these cases, your health care provider may recommend more frequent screening and Pap tests. Colorectal Cancer  This type of cancer can be detected and often prevented.  Routine colorectal cancer screening usually begins at 57 years of age and continues through 57 years of age.  Your health care provider may recommend screening at an earlier age if you have risk factors for colon cancer.  Your health care provider may also recommend using home test kits to check for hidden blood in the stool.  A small camera at the end of a tube can be used to examine your colon directly (sigmoidoscopy or colonoscopy). This is done to check for the earliest forms of colorectal  cancer.  Routine screening usually begins at age 50.  Direct examination of the colon should be repeated every 5-10 years through 57 years of age. However, you may need to be screened more often if early forms of precancerous polyps or small growths are found. Skin Cancer  Check your skin from head to toe regularly.  Tell your health care provider about any new moles or changes in moles, especially if there is a change in a mole's shape or color.  Also tell your health care provider if you have a mole that is larger than the size of a pencil eraser.  Always use sunscreen. Apply sunscreen liberally and repeatedly throughout the day.  Protect yourself by wearing long sleeves, pants, a wide-brimmed hat, and sunglasses whenever you are outside. HEART DISEASE, DIABETES, AND HIGH BLOOD PRESSURE   High blood pressure causes heart disease and increases the risk of stroke. High blood pressure is more likely to develop in:  People who have blood pressure in the high end   of the normal range (130-139/85-89 mm Hg).  People who are overweight or obese.  People who are African American.  If you are 38-23 years of age, have your blood pressure checked every 3-5 years. If you are 61 years of age or older, have your blood pressure checked every year. You should have your blood pressure measured twice--once when you are at a hospital or clinic, and once when you are not at a hospital or clinic. Record the average of the two measurements. To check your blood pressure when you are not at a hospital or clinic, you can use:  An automated blood pressure machine at a pharmacy.  A home blood pressure monitor.  If you are between 45 years and 39 years old, ask your health care provider if you should take aspirin to prevent strokes.  Have regular diabetes screenings. This involves taking a blood sample to check your fasting blood sugar level.  If you are at a normal weight and have a low risk for diabetes,  have this test once every three years after 57 years of age.  If you are overweight and have a high risk for diabetes, consider being tested at a younger age or more often. PREVENTING INFECTION  Hepatitis B  If you have a higher risk for hepatitis B, you should be screened for this virus. You are considered at high risk for hepatitis B if:  You were born in a country where hepatitis B is common. Ask your health care provider which countries are considered high risk.  Your parents were born in a high-risk country, and you have not been immunized against hepatitis B (hepatitis B vaccine).  You have HIV or AIDS.  You use needles to inject street drugs.  You live with someone who has hepatitis B.  You have had sex with someone who has hepatitis B.  You get hemodialysis treatment.  You take certain medicines for conditions, including cancer, organ transplantation, and autoimmune conditions. Hepatitis C  Blood testing is recommended for:  Everyone born from [birth year] through 1963.  Anyone with known risk factors for hepatitis C. Sexually transmitted infections (STIs)  You should be screened for sexually transmitted infections (STIs) including gonorrhea and chlamydia if:  You are sexually active and are younger than 57 years of age.  You are older than 57 years of age and your health care provider tells you that you are at risk for this type of infection.  Your sexual activity has changed since you were last screened and you are at an increased risk for chlamydia or gonorrhea. Ask your health care provider if you are at risk.  If you do not have HIV, but are at risk, it may be recommended that you take a prescription medicine daily to prevent HIV infection. This is called pre-exposure prophylaxis (PrEP). You are considered at risk if:  You are sexually active and do not regularly use condoms or know the HIV status of your partner(s).  You take drugs by injection.  You are sexually  active with a partner who has HIV. Talk with your health care provider about whether you are at high risk of being infected with HIV. If you choose to begin PrEP, you should first be tested for HIV. You should then be tested every 3 months for as long as you are taking PrEP.  PREGNANCY   If you are premenopausal and you may become pregnant, ask your health care provider about preconception counseling.  If you may  become pregnant, take 400 to 800 micrograms (mcg) of folic acid every day.  If you want to prevent pregnancy, talk to your health care provider about birth control (contraception). OSTEOPOROSIS AND MENOPAUSE   Osteoporosis is a disease in which the bones lose minerals and strength with aging. This can result in serious bone fractures. Your risk for osteoporosis can be identified using a bone density scan.  If you are 61 years of age or older, or if you are at risk for osteoporosis and fractures, ask your health care provider if you should be screened.  Ask your health care provider whether you should take a calcium or vitamin D supplement to lower your risk for osteoporosis.  Menopause may have certain physical symptoms and risks.  Hormone replacement therapy may reduce some of these symptoms and risks. Talk to your health care provider about whether hormone replacement therapy is right for you.  HOME CARE INSTRUCTIONS   Schedule regular health, dental, and eye exams.  Stay current with your immunizations.   Do not use any tobacco products including cigarettes, chewing tobacco, or electronic cigarettes.  If you are pregnant, do not drink alcohol.  If you are breastfeeding, limit how much and how often you drink alcohol.  Limit alcohol intake to no more than 1 drink per day for nonpregnant women. One drink equals 12 ounces of beer, 5 ounces of wine, or 1 ounces of hard liquor.  Do not use street drugs.  Do not share needles.  Ask your health care provider for help if  you need support or information about quitting drugs.  Tell your health care provider if you often feel depressed.  Tell your health care provider if you have ever been abused or do not feel safe at home.   This information is not intended to replace advice given to you by your health care provider. Make sure you discuss any questions you have with your health care provider.   Document Released: 10/23/2010 Document Revised: 04/30/2014 Document Reviewed: 03/11/2013 Elsevier Interactive Patient Education [employer].

## 2015-04-11 NOTE — Assessment & Plan Note (Signed)
General medical exam normal today including breast exam and pelvic exam. PAP pending. Mammogram ordered. Labs as ordered. Encouraged healthy diet and exercise. Immunizations except for flu vaccine which was given today.

## 2015-04-11 NOTE — Progress Notes (Signed)
Pre-visit discussion using our clinic review tool. No additional management support is needed unless otherwise documented below in the visit note.  

## 2015-04-11 NOTE — Progress Notes (Signed)
Subjective:    Patient ID: Natasha RudeSusan S Pace, female    DOB: Sep 08, 1955, 59 y.o.   MRN: [id number]  HPI  59YO female presents for physical exam.  Feeling well. No concerns today. Follows a healthy diet and exercises by walking and participating in yoga.  Wt Readings from Last 3 Encounters:  04/11/15 147 lb 8 oz (66.906 kg)  01/26/15 150 lb 6 oz (68.21 kg)  03/05/14 149 lb 4 oz (67.699 kg)   BP Readings from Last 3 Encounters:  04/11/15 118/80  01/26/15 140/96  03/05/14 130/78    Past Medical History  Diagnosis Date  . Chicken pox   . GERD (gastroesophageal reflux disease)   . Allergy     hay fever  . Hypertension   . Hyperlipidemia   . Vaginal polyp 2014    benign   Family History  Problem Relation Age of Onset  . Hypertension Mother   . Hypertension Father   . Diabetes Maternal Grandmother   . Cancer Maternal Grandmother     breast  . Diabetes Maternal Grandfather   . Cancer Other     ovarian/uterus  . Hypertension Brother    Past Surgical History  Procedure Laterality Date  . Vaginal delivery      2  . Intrauterine device insertion      [facility] [facility] [facility]   Social History   Social History  . Marital Status: Married    Spouse Name: N/A  . Number of Children: N/A  . Years of Education: N/A   Social History Main Topics  . Smoking status: Never Smoker   . Smokeless tobacco: None  . Alcohol Use: None  . Drug Use: None  . Sexual Activity: Not Asked   Other Topics Concern  . None   Social History Narrative   [place]ives in [place]. 2 girls in college.      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  Eyes: Negative for visual disturbance.  Respiratory: Negative for shortness of breath.   Cardiovascular: Negative for chest pain, palpitations and  leg swelling.  Gastrointestinal: Negative for nausea, vomiting, abdominal pain, diarrhea and constipation.  Musculoskeletal: Negative for myalgias and arthralgias.  Skin: Negative for color change and rash.  Hematological: Negative for adenopathy. Does not bruise/bleed easily.  Psychiatric/Behavioral: Negative for sleep disturbance and dysphoric mood. The patient is not nervous/anxious.        Objective:    BP 118/80 mmHg  Pulse 73  Temp(Src) 98.5 F (36.9 C) (Oral)  Resp 18  Ht 5\' 8"  (1.727 m)  Wt 147 lb 8 oz (66.906 kg)  BMI 22.43 kg/m2  SpO2 98% Physical Exam  Constitutional: She is oriented to person, place, and time. She appears well-developed and well-nourished. No distress.  HENT:  Head: Normocephalic and atraumatic.  Right Ear: External ear normal.  Left Ear: External ear normal.  Nose: Nose normal.  Mouth/Throat: Oropharynx is clear and moist. No oropharyngeal exudate.  Eyes: Conjunctivae are normal. Pupils are equal, round, and reactive to light. Right eye exhibits no discharge. Left eye exhibits no discharge. No scleral icterus.  Neck: Normal range of motion. Neck supple. No tracheal deviation present. No thyromegaly present.  Cardiovascular: Normal rate, regular rhythm, normal heart sounds and intact distal pulses.  Exam reveals no  gallop and no friction rub.   No murmur heard. Pulmonary/Chest: Effort normal and breath sounds normal. No respiratory distress. She has no wheezes. She has no rales. She exhibits no tenderness.  Abdominal: Soft. Bowel sounds are normal. She exhibits no distension and no mass. There is no tenderness. There is no rebound and no guarding.  Genitourinary: Rectum normal, vagina normal and uterus normal. No breast swelling, tenderness, discharge or bleeding. Pelvic exam was performed with patient supine. There is no rash, tenderness or lesion on the right labia. There is no rash, tenderness or lesion on the left labia. Uterus is not enlarged and  not tender. Cervix exhibits no motion tenderness, no discharge and no friability. Right adnexum displays no mass, no tenderness and no fullness. Left adnexum displays no mass, no tenderness and no fullness. No erythema or tenderness in the vagina. No vaginal discharge found.  Musculoskeletal: Normal range of motion. She exhibits no edema or tenderness.  Lymphadenopathy:    She has no cervical adenopathy.  Neurological: She is alert and oriented to person, place, and time. No cranial nerve deficit. She exhibits normal muscle tone. Coordination normal.  Skin: Skin is warm and dry. No rash noted. She is not diaphoretic. No erythema. No pallor.  Psychiatric: She has a normal mood and affect. Her behavior is normal. Judgment and thought content normal.          Assessment & Plan:   Problem List Items Addressed This Visit      Unprioritized   Routine general medical examination at a health care facility - Primary    General medical exam normal today including breast exam and pelvic exam. PAP pending. Mammogram ordered. Labs as ordered. Encouraged healthy diet and exercise. Immunizations except for flu vaccine which was given today.      Relevant Orders   CBC with Differential/Platelet   Comprehensive metabolic panel   Lipid panel   VITAMIN D 25 Hydroxy (Vit-D Deficiency, Fractures)   TSH    Other Visit Diagnoses    Screening breast examination        Relevant Orders    MM DIGITAL SCREENING BILATERAL        Return in about 1 year (around 04/10/2016) for Physical.

## 2015-04-13 LAB — CYTOLOGY - PAP

## 2015-04-21 ENCOUNTER — Ambulatory Visit: Payer: BC Managed Care – PPO

## 2015-04-22 ENCOUNTER — Ambulatory Visit
Admission: RE | Admit: 2015-04-22 | Discharge: 2015-04-22 | Disposition: A | Discharge status: Home / Self Care | Payer: BC Managed Care – PPO | Source: Ambulatory Visit | Attending: Internal Medicine | Admitting: Internal Medicine

## 2015-04-22 DIAGNOSIS — Z1231 Encounter for screening mammogram for malignant neoplasm of breast: Secondary | ICD-10-CM | POA: Insufficient documentation

## 2015-04-22 DIAGNOSIS — Z1239 Encounter for other screening for malignant neoplasm of breast: Secondary | ICD-10-CM

## 2015-05-05 ENCOUNTER — Other Ambulatory Visit: Payer: Self-pay | Admitting: Internal Medicine

## 2015-11-04 ENCOUNTER — Other Ambulatory Visit: Payer: Self-pay | Admitting: Internal Medicine

## 2015-11-08 ENCOUNTER — Ambulatory Visit (INDEPENDENT_AMBULATORY_CARE_PROVIDER_SITE_OTHER): Payer: BC Managed Care – PPO

## 2015-11-08 ENCOUNTER — Encounter: Payer: Self-pay | Admitting: Podiatry

## 2015-11-08 ENCOUNTER — Ambulatory Visit (INDEPENDENT_AMBULATORY_CARE_PROVIDER_SITE_OTHER): Payer: BC Managed Care – PPO | Admitting: Podiatry

## 2015-11-08 DIAGNOSIS — M722 Plantar fascial fibromatosis: Secondary | ICD-10-CM

## 2015-11-08 MED ORDER — MELOXICAM 15 MG PO TABS: 15.0000 mg | ORAL_TABLET | Freq: Every day | ORAL | Status: DC

## 2015-11-08 NOTE — Patient Instructions (Signed)

## 2015-11-09 NOTE — Progress Notes (Signed)
Subjective:     Patient ID: Natasha Pace, female   DOB: 1956/02/16, 60 y.o.   MRN: [id number]  HPI 60 year old female presents the also concerns of bilateral foot pain. She states her right foot is cutting not hurting but she does get some occasional discomfort to the heel. She states that left side the heel pain has been ongoing for the last 4 months. She states that she's had plantar fasciitis before she had orthotics made about 5 years ago. She doesn't her orthotics are worn out and she needs new ones and she is unable to get rid of pain to the left heel that feels the exact same that it did previously. No recent injury or trauma. Pain is worse in the morning she gets up or after did a lot of walking or standing. Denies any recent injury or trauma. No swelling or redness. No numbness or tingling. The pain does not wake her up at night. No other complaints.  Review of Systems  All other systems reviewed and are negative.      Objective:   Physical Exam General: AAO x3, NAD  Dermatological: Skin is warm, dry and supple bilateral. Nails x 10 are well manicured; remaining integument appears unremarkable at this time. There are no open sores, no preulcerative lesions, no rash or signs of infection present.  Vascular: Dorsalis Pedis artery and Posterior Tibial artery pedal pulses are 2/4 bilateral with immedate capillary fill time. Pedal hair growth present. No varicosities and no lower extremity edema present bilateral. There is no pain with calf compression, swelling, warmth, erythema.   Neruologic: Grossly intact via light touch bilateral. Vibratory intact via tuning fork bilateral. Protective threshold with Semmes Wienstein monofilament intact to all pedal sites bilateral. Patellar and Achilles deep tendon reflexes 2+ bilateral. No Babinski or clonus noted bilateral.   Musculoskeletal: Tenderness to palpation along the plantar medial tubercle of the calcaneus at the insertion of plantar fascia  on the left foot. There is no pain along the course of the plantar fascia within the arch of the foot. Plantar fascia appears to be intact. There is no pain with lateral compression of the calcaneus or pain with vibratory sensation. There is no pain along the course or insertion of the achilles tendon. No other areas of tenderness to bilateral lower extremities. MMT 5/5, ROM WNL.   Gait: Unassisted, Nonantalgic.      Assessment:     Left heel pain, likely plantar fasciitis    Plan:     -Treatment options discussed including all alternatives, risks, and complications -Etiology of symptoms were discussed -X-rays were obtained and reviewed with the patient. Inferior calcaneal spurring is present. No evidence of acute fracture or stress fracture. -Patient elects to proceed with steroid injection into the left heel. Under sterile skin preparation, a total of 2.5cc of kenalog 10, 0.5% Marcaine plain, and 2% lidocaine plain were infiltrated into the symptomatic area without complication. A band-aid was applied. Patient tolerated the injection well without complication. Post-injection care with discussed with the patient. Discussed with the patient to ice the area over the next couple of days to help prevent a steroid flare.  -Plantar fascial brace dispensed -Prescribed mobic. Discussed side effects of the medication and directed to stop if any are to occur and call the office.  -Stretching and icing exercises daily -Discussed shoe gear modifications. She'll possibly new orthotics and surgical worn out. She was casted for orthotics today and they were sent to [facility] labs.  -Follow-up  in 4 weeks or sooner if any problems arise. In the meantime, encouraged to call the office with any questions, concerns, change in symptoms.   Ovid CurdMatthew Wagoner, DPM

## 2015-12-06 ENCOUNTER — Ambulatory Visit (INDEPENDENT_AMBULATORY_CARE_PROVIDER_SITE_OTHER): Payer: BC Managed Care – PPO | Admitting: Podiatry

## 2015-12-06 ENCOUNTER — Encounter: Payer: Self-pay | Admitting: Podiatry

## 2015-12-06 DIAGNOSIS — M722 Plantar fascial fibromatosis: Secondary | ICD-10-CM

## 2015-12-11 NOTE — Progress Notes (Signed)
Subjective: 60 year old female presents the office today for follow-up evaluation of left plantar fasciitis. She states that the injection helped quite a bit and she doesn't medicine for about 5 days afterwards the pain is subsiding she's going on expansion any pain.Denies any systemic complaints such as fevers, chills, nausea, vomiting. No acute changes since last appointment, and no other complaints at this time.   Objective: AAO x3, NAD DP/PT pulses palpable bilaterally, CRT less than 3 seconds There is currently no tenderness to palpation along the plantar medial tubercle of the calcaneus at the insertion of plantar fascia on the left foot. There is no pain along the course of the plantar fascia within the arch of the foot. Plantar fascia appears to be intact. There is no pain with lateral compression of the calcaneus or pain with vibratory sensation. There is no pain along the course or insertion of the achilles tendon. No other areas of tenderness to bilateral lower extremities. No edema, erythema, increase in warmth to bilateral lower extremities.  No open lesions or pre-ulcerative lesions.  No pain with calf compression, swelling, warmth, erythema  Assessment: Resolving plantar fasciitis  Plan: -All treatment options discussed with the patient including all alternatives, risks, complications.  -Orthotics were dispensed today. Oral and written break in instructions were discussed. -Continue ice and stretching exercises daily. Continue supportive shoe gear. Anti-inflammatories as needed. -4 weeks if any symptoms continue or sooner if any are to arise.  -Patient encouraged to call the office with any questions, concerns, change in symptoms.   Ovid CurdMatthew Kingdavid Leinbach, DPM

## 2016-01-12 ENCOUNTER — Ambulatory Visit: Payer: BC Managed Care – PPO | Admitting: Podiatry

## 2016-02-27 ENCOUNTER — Telehealth: Payer: Self-pay | Admitting: Internal Medicine

## 2016-02-27 NOTE — Telephone Encounter (Signed)
Pt called asking for a refill on hermetoprolol succinate (TOPROL-XL) 50 MG 24 hr tablet. Pt is establishing care with M.Arnett on 05/14/16. Please advise, thank you!  Pharmacy - [facility] [phone]#[id number] [address]- [place], [place] - [id number]  Call pt @ [phone]

## 2016-02-28 MED ORDER — METOPROLOL SUCCINATE ER 50 MG PO TB24: ORAL_TABLET | ORAL | 0 refills | Status: DC

## 2016-02-28 NOTE — Telephone Encounter (Signed)
Medication has been refilled.

## 2016-04-02 ENCOUNTER — Telehealth: Payer: Self-pay | Admitting: Family

## 2016-04-02 ENCOUNTER — Encounter: Payer: Self-pay | Admitting: Family

## 2016-04-02 DIAGNOSIS — N952 Postmenopausal atrophic vaginitis: Secondary | ICD-10-CM

## 2016-04-02 MED ORDER — ESTROGENS, CONJUGATED 0.625 MG/GM VA CREA: TOPICAL_CREAM | VAGINAL | 2 refills | Status: DC

## 2016-04-02 NOTE — Telephone Encounter (Signed)
Call pt-  Refilled premarin however pt is due for CPE. Please advise f/u. Ensure patient knows to use premarin ONCE per week. We want the least amount of exposure.

## 2016-04-03 NOTE — Telephone Encounter (Signed)
Left message for patient to return call back.  

## 2016-04-05 NOTE — Telephone Encounter (Signed)
Pt called back. Thank you!  Call pt @ [phone]

## 2016-04-05 NOTE — Telephone Encounter (Signed)
Left message for patient to return call back.  

## 2016-04-05 NOTE — Telephone Encounter (Signed)
Pt called back returning your call. Best time to reach pt is at 10:15-11:30. @ [phone]. Thank you!

## 2016-04-06 NOTE — Telephone Encounter (Signed)
Left message for patient to return call back.  

## 2016-04-06 NOTE — Telephone Encounter (Signed)
Pt is aware of Claris CheMargaret Advise, pt understands statement. Pt has a upcoming appt for a physical in jan

## 2016-05-04 ENCOUNTER — Other Ambulatory Visit: Payer: Self-pay

## 2016-05-04 MED ORDER — ATORVASTATIN CALCIUM 20 MG PO TABS: 20.0000 mg | ORAL_TABLET | Freq: Every day | ORAL | 0 refills | Status: DC

## 2016-05-04 NOTE — Telephone Encounter (Signed)
Medication has been refilled only 60 days, due to patient not being seen by PCP for over a year. Must see PCP.

## 2016-05-14 ENCOUNTER — Ambulatory Visit (INDEPENDENT_AMBULATORY_CARE_PROVIDER_SITE_OTHER): Payer: BC Managed Care – PPO | Admitting: Family

## 2016-05-14 ENCOUNTER — Encounter: Payer: Self-pay | Admitting: Family

## 2016-05-14 ENCOUNTER — Telehealth: Payer: Self-pay | Admitting: Radiology

## 2016-05-14 VITALS — BP 130/80 | HR 76 | Temp 98.5°F | Ht 68.0 in | Wt 144.0 lb

## 2016-05-14 DIAGNOSIS — Z0000 Encounter for general adult medical examination without abnormal findings: Secondary | ICD-10-CM | POA: Diagnosis not present

## 2016-05-14 DIAGNOSIS — J309 Allergic rhinitis, unspecified: Secondary | ICD-10-CM

## 2016-05-14 DIAGNOSIS — N952 Postmenopausal atrophic vaginitis: Secondary | ICD-10-CM | POA: Diagnosis not present

## 2016-05-14 DIAGNOSIS — K219 Gastro-esophageal reflux disease without esophagitis: Secondary | ICD-10-CM | POA: Diagnosis not present

## 2016-05-14 DIAGNOSIS — I10 Essential (primary) hypertension: Secondary | ICD-10-CM | POA: Diagnosis not present

## 2016-05-14 NOTE — Progress Notes (Signed)
Pre visit review using our clinic review tool, if applicable. No additional management support is needed unless otherwise documented below in the visit note. 

## 2016-05-14 NOTE — Assessment & Plan Note (Signed)
Afebrile. Symptoms most consistent with allergic etiology. Also considering viral etiology. Patient will start back on Nasonex and let me know if symptoms do not improve. We discussed a short course of prednisone if Nasonex doesn't help.

## 2016-05-14 NOTE — Assessment & Plan Note (Signed)
Controlled. Continue current regimen. 

## 2016-05-14 NOTE — Assessment & Plan Note (Addendum)
At goal. Continue current regimen. 

## 2016-05-14 NOTE — Telephone Encounter (Signed)
Pt labs that were ordered today were standing, did you want these as future orders? If so, could you change to future ordres? Thank you.

## 2016-05-14 NOTE — Progress Notes (Signed)
Subjective:    Patient ID: Natasha RudeSusan S Folds, female    DOB: 11-23-1955, 61 y.o.   MRN: [id number]  CC: Natasha RudeSusan S Fossum is a 61 y.o. female who presents today for physical exam.    HPI: C/o clear runny sinus congestion x one week, improving. Some relief with antihistamine. Endorses fever 2 days ago, resolved. No cough, wheezing.   GERD -controlled nexium. Had been on protonix which worked well. No noctural symptoms, hoarseness. Diet triggers and  Trying to be better about foods and not eating so late. No trouble swallowing.   On vaginal estrogen for vaginal dryness which works well. no      Colorectal Cancer Screening: 2012, normal per pt and no polpys. No record.  Breast Cancer Screening: Mammogram due.  Cervical Cancer Screening: UTD 2016, negative HPV, no malignancy Bone Health screening/DEXA for 65+: No increased fracture risk. Defer screening at this time Lung Cancer Screening: Doesn't have 30 year pack year history and age > 55 years.       Tetanus - UTD        Pneumococcal - Not Candidate for.  Hepatitis C screening - Candidate for HIV Screening- Candidate for  Labs: Screening labs today. Exercise: Gets regular exercise, yoga  Alcohol AOZ:HYQMVHQIONuse:Occasional  Smoking/tobacco use: Nonsmoker.  Regular dental exams: UTD Wears seat belt: Yes. Skin_ no concerning skin lesions.   HISTORY:  Past Medical History:  Diagnosis Date  . Allergy    hay fever  . Chicken pox   . GERD (gastroesophageal reflux disease)   . Hyperlipidemia   . Hypertension   . Vaginal polyp 2014   benign    Past Surgical History:  Procedure Laterality Date  . INTRAUTERINE DEVICE INSERTION     [facility]  . VAGINAL DELIVERY     2   Family History  Problem Relation Age of Onset  . Hypertension Mother   . Diabetes Maternal Grandmother   . Cancer Maternal Grandmother     breast  . Breast cancer Maternal Grandmother [id number]  . Hypertension Brother   . Hypertension  Father   . Diabetes Maternal Grandfather   . Ovarian cancer Other 74    ovarian/uterus, Aunt,       ALLERGIES: Patient has no known allergies.  Current Outpatient Prescriptions on File Prior to Visit  Medication Sig Dispense Refill  . atorvastatin (LIPITOR) 20 MG tablet Take 1 tablet (20 mg total) by mouth daily. 60 tablet 0  . cetirizine (ZYRTEC) 10 MG tablet Take 10 mg by mouth daily.    Marland Kitchen. conjugated estrogens (PREMARIN) vaginal cream 0.5 g intravaginally once per week.. 30 g 2  . metoprolol succinate (TOPROL-XL) 50 MG 24 hr tablet TAKE 1 TABLET BY MOUTH EVERY DAY (MUST KEEP 03/05/14 APPT) 90 tablet 0  . NASONEX 50 MCG/ACT nasal spray   12   No current facility-administered medications on file prior to visit.     Social History  Substance Use Topics  . Smoking status: Never Smoker  . Smokeless tobacco: Never Used  . Alcohol use Not on file    Review of Systems  Constitutional: Negative for chills, fever and unexpected weight change.  HENT: Positive for rhinorrhea and sinus pressure. Negative for congestion, postnasal drip, sinus pain, sore throat and trouble swallowing.   Respiratory: Negative for cough and shortness of breath.   Cardiovascular: Negative for chest pain, palpitations and leg swelling.  Gastrointestinal: Negative for nausea and vomiting.  Genitourinary: Negative for  dyspareunia, hematuria, pelvic pain, vaginal discharge and vaginal pain.  Musculoskeletal: Negative for arthralgias and myalgias.  Skin: Negative for rash.  Neurological: Negative for headaches.  Hematological: Negative for adenopathy.  Psychiatric/Behavioral: Negative for confusion.      Objective:    BP 130/80   Pulse 76   Temp 98.5 F (36.9 C) (Oral)   Ht 5\' 8"  (1.727 m)   Wt 144 lb (65.3 kg)   SpO2 97%   BMI 21.90 kg/m   BP Readings from Last 3 Encounters:  05/14/16 130/80  04/11/15 118/80  01/26/15 (!) 140/96   Wt Readings from Last 3 Encounters:  05/14/16 144 lb (65.3 kg)    04/11/15 147 lb 8 oz (66.9 kg)  01/26/15 150 lb 6 oz (68.2 kg)    Physical Exam  Constitutional: She appears well-developed and well-nourished.  HENT:  Head: Normocephalic and atraumatic.  Right Ear: Hearing, tympanic membrane, external ear and ear canal normal. No drainage, swelling or tenderness. No foreign bodies. Tympanic membrane is not erythematous and not bulging. No middle ear effusion. No decreased hearing is noted.  Left Ear: Hearing, tympanic membrane, external ear and ear canal normal. No drainage, swelling or tenderness. No foreign bodies. Tympanic membrane is not erythematous and not bulging.  No middle ear effusion. No decreased hearing is noted.  Nose: Nose normal. No rhinorrhea. Right sinus exhibits no maxillary sinus tenderness and no frontal sinus tenderness. Left sinus exhibits no maxillary sinus tenderness and no frontal sinus tenderness.  Mouth/Throat: Uvula is midline, oropharynx is clear and moist and mucous membranes are normal. No oropharyngeal exudate, posterior oropharyngeal edema, posterior oropharyngeal erythema or tonsillar abscesses.  Eyes: Conjunctivae are normal.  Neck: No thyroid mass and no thyromegaly present.  Cardiovascular: Normal rate, regular rhythm, normal heart sounds and normal pulses.   Pulmonary/Chest: Effort normal and breath sounds normal. She has no wheezes. She has no rhonchi. She has no rales. Right breast exhibits no inverted nipple, no mass, no nipple discharge, no skin change and no tenderness. Left breast exhibits no inverted nipple, no mass, no nipple discharge, no skin change and no tenderness. Breasts are symmetrical.  CBE performed.   Lymphadenopathy:       Head (right side): No submental, no submandibular, no tonsillar, no preauricular, no posterior auricular and no occipital adenopathy present.       Head (left side): No submental, no submandibular, no tonsillar, no preauricular, no posterior auricular and no occipital adenopathy  present.    She has no cervical adenopathy.       Right cervical: No superficial cervical, no deep cervical and no posterior cervical adenopathy present.      Left cervical: No superficial cervical, no deep cervical and no posterior cervical adenopathy present.    She has no axillary adenopathy.  Neurological: She is alert.  Skin: Skin is warm and dry.  Psychiatric: She has a normal mood and affect. Her speech is normal and behavior is normal. Thought content normal.  Vitals reviewed.      Assessment & Plan:   Problem List Items Addressed This Visit      Cardiovascular and Mediastinum   Hypertension    At goal. Continue current regimen         Respiratory   Allergic rhinitis    Afebrile. Symptoms most consistent with allergic etiology. Also considering viral etiology. Patient will start back on Nasonex and let me know if symptoms do not improve. We discussed a short course of prednisone if Nasonex  doesn't help.        Digestive   GERD (gastroesophageal reflux disease)    Controlled with some flares. Discussed being more consistent with lifestyle modifications and alarm symptoms. Declined EGD at this time.         Genitourinary   Atrophic vaginitis    Controlled. Continue current regimen.         Other   Routine general medical examination at a health care facility - Primary    Up-to-date colonoscopy. Patient will schedule mammogram. CBE performed. Up-to-date Pap smear and declines pelvic exam as no symptoms today and preference. Up-to-date on immunizations. Pending fasting labs. Congratulated patient on regular exercise.      Relevant Orders   CBC with Differential/Platelet   Comprehensive metabolic panel   Hemoglobin A1c   Hepatitis C antibody   HIV antibody   Lipid panel   TSH   VITAMIN D 25 Hydroxy (Vit-D Deficiency, Fractures)   MM DIGITAL SCREENING BILATERAL       I have discontinued Ms. Nadel's loratadine, pantoprazole, cyclobenzaprine, and meloxicam.  I am also having her maintain her NASONEX, cetirizine, metoprolol succinate, conjugated estrogens, and atorvastatin.   No orders of the defined types were placed in this encounter.   Return precautions given.   Risks, benefits, and alternatives of the medications and treatment plan prescribed today were discussed, and patient expressed understanding.   Education regarding symptom management and diagnosis given to patient on AVS.   Continue to follow with Rennie Plowman, FNP for routine health maintenance.   Natasha Pace and I agreed with plan.   Rennie Plowman, FNP

## 2016-05-14 NOTE — Patient Instructions (Addendum)
Try getting back on nasonex. Let me know if not better  Make fasting lab appt when you can.   We placed a referral. Mammogram this year. I asked that you call one the below locations and schedule this when it is convenient for you.   If you have dense breasts, you may ask for 3D mammogram over the traditional 2D mammogram as new evidence suggest 3D is superior. Please note that NOT all insurance companies cover 3D and you may have to pay a higher copay. You may call your insurance company to further clarify your benefits.   Options for [place]  [place], [place]  * [facility] [place], [place] * Note if you ask for 3D mammogram at this location, you must request [place], [place] location*   Health Maintenance, Female Introduction Adopting a healthy lifestyle and getting preventive care can go a long way to promote health and wellness. Talk with your health care provider about what schedule of regular examinations is right for you. This is a good chance for you to check in with your provider about disease prevention and staying healthy. In between checkups, there are plenty of things you can do on your own. Experts have done a lot of research about which lifestyle changes and preventive measures are most likely to keep you healthy. Ask your health care provider for more information. Weight and diet Eat a healthy diet  Be sure to include plenty of vegetables, fruits, low-fat dairy products, and lean protein.  Do not eat a lot of foods high in solid fats, added sugars, or salt.  Get regular exercise. This is one of the most important things you can do for your health.  Most adults should exercise for at least 150 minutes each week. The exercise should increase your heart rate and make you sweat (moderate-intensity exercise).  Most  adults should also do strengthening exercises at least twice a week. This is in addition to the moderate-intensity exercise. Maintain a healthy weight  Body mass index (BMI) is a measurement that can be used to identify possible weight problems. It estimates body fat based on height and weight. Your health care provider can help determine your BMI and help you achieve or maintain a healthy weight.  For females 42 years of age and older:  A BMI below 18.5 is considered underweight.  A BMI of 18.5 to 24.9 is normal.  A BMI of 25 to 29.9 is considered overweight.  A BMI of 30 and above is considered obese. Watch levels of cholesterol and blood lipids  You should start having your blood tested for lipids and cholesterol at 59 years of age, then have this test every 5 years.  You may need to have your cholesterol levels checked more often if:  Your lipid or cholesterol levels are high.  You are older than 59 years of age.  You are at high risk for heart disease. Cancer screening Lung Cancer  Lung cancer screening is recommended for adults 45-33 years old who are at high risk for lung cancer because of a history of smoking.  A yearly low-dose CT scan of the lungs is recommended for people who:  Currently smoke.  Have quit within the past 15 years.  Have at least a 30-pack-year history of smoking. A pack year is smoking an average of one  pack of cigarettes a day for 1 year.  Yearly screening should continue until it has been 15 years since you quit.  Yearly screening should stop if you develop a health problem that would prevent you from having lung cancer treatment. Breast Cancer  Practice breast self-awareness. This means understanding how your breasts normally appear and feel.  It also means doing regular breast self-exams. Let your health care provider know about any changes, no matter how small.  If you are in your 20s or 30s, you should have a clinical breast exam (CBE)  by a health care provider every 1-3 years as part of a regular health exam.  If you are 73 or older, have a CBE every year. Also consider having a breast X-ray (mammogram) every year.  If you have a family history of breast cancer, talk to your health care provider about genetic screening.  If you are at high risk for breast cancer, talk to your health care provider about having an MRI and a mammogram every year.  Breast cancer gene (BRCA) assessment is recommended for women who have family members with BRCA-related cancers. BRCA-related cancers include:  Breast.  Ovarian.  Tubal.  Peritoneal cancers.  Results of the assessment will determine the need for genetic counseling and BRCA1 and BRCA2 testing. Cervical Cancer  Your health care provider may recommend that you be screened regularly for cancer of the pelvic organs (ovaries, uterus, and vagina). This screening involves a pelvic examination, including checking for microscopic changes to the surface of your cervix (Pap test). You may be encouraged to have this screening done every 3 years, beginning at age 21.  For women ages 9-65, health care providers may recommend pelvic exams and Pap testing every 3 years, or they may recommend the Pap and pelvic exam, combined with testing for human papilloma virus (HPV), every 5 years. Some types of HPV increase your risk of cervical cancer. Testing for HPV may also be done on women of any age with unclear Pap test results.  Other health care providers may not recommend any screening for nonpregnant women who are considered low risk for pelvic cancer and who do not have symptoms. Ask your health care provider if a screening pelvic exam is right for you.  If you have had past treatment for cervical cancer or a condition that could lead to cancer, you need Pap tests and screening for cancer for at least 20 years after your treatment. If Pap tests have been discontinued, your risk factors (such as  having a new sexual partner) need to be reassessed to determine if screening should resume. Some women have medical problems that increase the chance of getting cervical cancer. In these cases, your health care provider may recommend more frequent screening and Pap tests. Colorectal Cancer  This type of cancer can be detected and often prevented.  Routine colorectal cancer screening usually begins at 59 years of age and continues through 59 years of age.  Your health care provider may recommend screening at an earlier age if you have risk factors for colon cancer.  Your health care provider may also recommend using home test kits to check for hidden blood in the stool.  A small camera at the end of a tube can be used to examine your colon directly (sigmoidoscopy or colonoscopy). This is done to check for the earliest forms of colorectal cancer.  Routine screening usually begins at age 57.  Direct examination of the colon should be repeated every  5-10 years through 59 years of age. However, you may need to be screened more often if early forms of precancerous polyps or small growths are found. Skin Cancer  Check your skin from head to toe regularly.  Tell your health care provider about any new moles or changes in moles, especially if there is a change in a mole's shape or color.  Also tell your health care provider if you have a mole that is larger than the size of a pencil eraser.  Always use sunscreen. Apply sunscreen liberally and repeatedly throughout the day.  Protect yourself by wearing long sleeves, pants, a wide-brimmed hat, and sunglasses whenever you are outside. Heart disease, diabetes, and high blood pressure  High blood pressure causes heart disease and increases the risk of stroke. High blood pressure is more likely to develop in:  People who have blood pressure in the high end of the normal range (130-139/85-89 mm Hg).  People who are overweight or obese.  People who  are African American.  If you are 36-23 years of age, have your blood pressure checked every 3-5 years. If you are 78 years of age or older, have your blood pressure checked every year. You should have your blood pressure measured twice-once when you are at a hospital or clinic, and once when you are not at a hospital or clinic. Record the average of the two measurements. To check your blood pressure when you are not at a hospital or clinic, you can use:  An automated blood pressure machine at a pharmacy.  A home blood pressure monitor.  If you are between 32 years and 25 years old, ask your health care provider if you should take aspirin to prevent strokes.  Have regular diabetes screenings. This involves taking a blood sample to check your fasting blood sugar level.  If you are at a normal weight and have a low risk for diabetes, have this test once every three years after 59 years of age.  If you are overweight and have a high risk for diabetes, consider being tested at a younger age or more often. Preventing infection Hepatitis B  If you have a higher risk for hepatitis B, you should be screened for this virus. You are considered at high risk for hepatitis B if:  You were born in a country where hepatitis B is common. Ask your health care provider which countries are considered high risk.  Your parents were born in a high-risk country, and you have not been immunized against hepatitis B (hepatitis B vaccine).  You have HIV or AIDS.  You use needles to inject street drugs.  You live with someone who has hepatitis B.  You have had sex with someone who has hepatitis B.  You get hemodialysis treatment.  You take certain medicines for conditions, including cancer, organ transplantation, and autoimmune conditions. Hepatitis C  Blood testing is recommended for:  Everyone born from [birth year] through 1963.  Anyone with known risk factors for hepatitis C. Sexually transmitted  infections (STIs)  You should be screened for sexually transmitted infections (STIs) including gonorrhea and chlamydia if:  You are sexually active and are younger than 58 years of age.  You are older than 59 years of age and your health care provider tells you that you are at risk for this type of infection.  Your sexual activity has changed since you were last screened and you are at an increased risk for chlamydia or gonorrhea. Ask your health  care provider if you are at risk.  If you do not have HIV, but are at risk, it may be recommended that you take a prescription medicine daily to prevent HIV infection. This is called pre-exposure prophylaxis (PrEP). You are considered at risk if:  You are sexually active and do not regularly use condoms or know the HIV status of your partner(s).  You take drugs by injection.  You are sexually active with a partner who has HIV. Talk with your health care provider about whether you are at high risk of being infected with HIV. If you choose to begin PrEP, you should first be tested for HIV. You should then be tested every 3 months for as long as you are taking PrEP. Pregnancy  If you are premenopausal and you may become pregnant, ask your health care provider about preconception counseling.  If you may become pregnant, take 400 to 800 micrograms (mcg) of folic acid every day.  If you want to prevent pregnancy, talk to your health care provider about birth control (contraception). Osteoporosis and menopause  Osteoporosis is a disease in which the bones lose minerals and strength with aging. This can result in serious bone fractures. Your risk for osteoporosis can be identified using a bone density scan.  If you are 63 years of age or older, or if you are at risk for osteoporosis and fractures, ask your health care provider if you should be screened.  Ask your health care provider whether you should take a calcium or vitamin D supplement to lower  your risk for osteoporosis.  Menopause may have certain physical symptoms and risks.  Hormone replacement therapy may reduce some of these symptoms and risks. Talk to your health care provider about whether hormone replacement therapy is right for you. Follow these instructions at home:  Schedule regular health, dental, and eye exams.  Stay current with your immunizations.  Do not use any tobacco products including cigarettes, chewing tobacco, or electronic cigarettes.  If you are pregnant, do not drink alcohol.  If you are breastfeeding, limit how much and how often you drink alcohol.  Limit alcohol intake to no more than 1 drink per day for nonpregnant women. One drink equals 12 ounces of beer, 5 ounces of wine, or 1 ounces of hard liquor.  Do not use street drugs.  Do not share needles.  Ask your health care provider for help if you need support or information about quitting drugs.  Tell your health care provider if you often feel depressed.  Tell your health care provider if you have ever been abused or do not feel safe at home. This information is not intended to replace advice given to you by your health care provider. Make sure you discuss any questions you have with your health care provider. Document Released: 10/23/2010 Document Revised: 09/15/2015 Document Reviewed: 01/11/2015  2017 Elsevier

## 2016-05-14 NOTE — Assessment & Plan Note (Signed)
Controlled with some flares. Discussed being more consistent with lifestyle modifications and alarm symptoms. Declined EGD at this time.

## 2016-05-14 NOTE — Assessment & Plan Note (Signed)
Up-to-date colonoscopy. Patient will schedule mammogram. CBE performed. Up-to-date Pap smear and declines pelvic exam as no symptoms today and preference. Up-to-date on immunizations. Pending fasting labs. Congratulated patient on regular exercise.

## 2016-05-14 NOTE — Addendum Note (Signed)
Addended by: Allegra GranaARNETT, Romualdo Prosise G on: 05/14/2016 10:55 AM   Modules accepted: Orders

## 2016-05-18 ENCOUNTER — Other Ambulatory Visit (INDEPENDENT_AMBULATORY_CARE_PROVIDER_SITE_OTHER): Payer: BC Managed Care – PPO

## 2016-05-18 DIAGNOSIS — Z0000 Encounter for general adult medical examination without abnormal findings: Secondary | ICD-10-CM

## 2016-05-18 LAB — CBC WITH DIFFERENTIAL/PLATELET
Basophils Absolute: 0 10*3/uL (ref 0.0–0.1)
Basophils Relative: 0.5 % (ref 0.0–3.0)
EOS ABS: 0.1 10*3/uL (ref 0.0–0.7)
EOS PCT: 1.8 % (ref 0.0–5.0)
HCT: 39.2 % (ref 36.0–46.0)
HEMOGLOBIN: 13.4 g/dL (ref 12.0–15.0)
LYMPHS ABS: 2.8 10*3/uL (ref 0.7–4.0)
Lymphocytes Relative: 46.5 % — ABNORMAL HIGH (ref 12.0–46.0)
MCHC: 34.3 g/dL (ref 30.0–36.0)
MCV: 87 fl (ref 78.0–100.0)
MONO ABS: 0.4 10*3/uL (ref 0.1–1.0)
Monocytes Relative: 6.8 % (ref 3.0–12.0)
NEUTROS PCT: 44.4 % (ref 43.0–77.0)
Neutro Abs: 2.7 10*3/uL (ref 1.4–7.7)
Platelets: 275 10*3/uL (ref 150.0–400.0)
RBC: 4.5 Mil/uL (ref 3.87–5.11)
RDW: 13.3 % (ref 11.5–15.5)
WBC: 6 10*3/uL (ref 4.0–10.5)

## 2016-05-18 LAB — HEMOGLOBIN A1C: Hgb A1c MFr Bld: 5.9 % (ref 4.6–6.5)

## 2016-05-18 LAB — LIPID PANEL
CHOL/HDL RATIO: 4
Cholesterol: 176 mg/dL (ref 0–200)
HDL: 44.6 mg/dL (ref >39.00)
LDL CALC: 101 mg/dL — AB (ref 0–99)
NONHDL: 131.19
Triglycerides: 149 mg/dL (ref 0.0–149.0)
VLDL: 29.8 mg/dL (ref 0.0–40.0)

## 2016-05-18 LAB — COMPREHENSIVE METABOLIC PANEL
ALBUMIN: 4.6 g/dL (ref 3.5–5.2)
ALT: 23 U/L (ref 0–35)
AST: 22 U/L (ref 0–37)
Alkaline Phosphatase: 60 U/L (ref 39–117)
BUN: 19 mg/dL (ref 6–23)
CO2: 32 mEq/L (ref 19–32)
CREATININE: 0.76 mg/dL (ref 0.40–1.20)
Calcium: 9.8 mg/dL (ref 8.4–10.5)
Chloride: 101 mEq/L (ref 96–112)
GFR: 82.96 mL/min (ref >60.00)
GLUCOSE: 89 mg/dL (ref 70–99)
POTASSIUM: 4.4 meq/L (ref 3.5–5.1)
SODIUM: 138 meq/L (ref 135–145)
Total Bilirubin: 0.7 mg/dL (ref 0.2–1.2)
Total Protein: 7.8 g/dL (ref 6.0–8.3)

## 2016-05-18 LAB — VITAMIN D 25 HYDROXY (VIT D DEFICIENCY, FRACTURES): VITD: 23.7 ng/mL — ABNORMAL LOW (ref 30.00–100.00)

## 2016-05-18 LAB — TSH: TSH: 1.02 u[IU]/mL (ref 0.35–4.50)

## 2016-05-19 LAB — HEPATITIS C ANTIBODY: HCV AB: NEGATIVE

## 2016-05-19 LAB — HIV ANTIBODY (ROUTINE TESTING W REFLEX): HIV: NONREACTIVE

## 2016-05-25 ENCOUNTER — Other Ambulatory Visit: Payer: Self-pay | Admitting: Family

## 2016-06-01 NOTE — Telephone Encounter (Signed)
Pt called and stated that she has no more medication left. Please advise, thank you!  Pharmacy - [id number] [place]- [place], [place] - [id number]  Call pt @ [phone]

## 2016-06-09 ENCOUNTER — Other Ambulatory Visit: Payer: Self-pay | Admitting: Family

## 2016-06-25 ENCOUNTER — Encounter: Payer: Self-pay | Admitting: Family

## 2016-06-26 ENCOUNTER — Telehealth: Payer: Self-pay

## 2016-06-26 NOTE — Telephone Encounter (Signed)
LMTCB

## 2016-06-26 NOTE — Telephone Encounter (Deleted)
LMTCB regarding lab results from January 2018.

## 2016-06-26 NOTE — Telephone Encounter (Signed)
Error

## 2016-06-27 ENCOUNTER — Ambulatory Visit
Admission: RE | Admit: 2016-06-27 | Discharge: 2016-06-27 | Disposition: A | Discharge status: Home / Self Care | Payer: BC Managed Care – PPO | Source: Ambulatory Visit | Attending: Family | Admitting: Family

## 2016-06-27 DIAGNOSIS — Z1231 Encounter for screening mammogram for malignant neoplasm of breast: Secondary | ICD-10-CM | POA: Diagnosis present

## 2016-06-27 DIAGNOSIS — Z0000 Encounter for general adult medical examination without abnormal findings: Secondary | ICD-10-CM

## 2016-07-02 NOTE — Telephone Encounter (Signed)
Error

## 2016-07-02 NOTE — Telephone Encounter (Signed)
Left message to return call to our office.  

## 2016-07-03 NOTE — Telephone Encounter (Signed)
Spoke to patient she did not want to make app for labs at this time I offered  to mail copy and she declined will call back if any questions.

## 2016-07-23 ENCOUNTER — Telehealth: Payer: Self-pay | Admitting: Family

## 2016-07-23 MED ORDER — ATORVASTATIN CALCIUM 20 MG PO TABS: 20.0000 mg | ORAL_TABLET | Freq: Every day | ORAL | 2 refills | Status: DC

## 2016-07-23 NOTE — Telephone Encounter (Signed)
Medication has been refilled.

## 2016-07-23 NOTE — Telephone Encounter (Signed)
Pt called needing a refill for atorvastatin (LIPITOR) 20 MG tablet.  [id number] [place], [place] - [address]  Call pt @ [phone]. Thank you!

## 2016-08-28 ENCOUNTER — Other Ambulatory Visit: Payer: Self-pay | Admitting: Family

## 2016-08-31 NOTE — Telephone Encounter (Signed)
Pt called to follow up on the medication. Please advise?  Call pt @ [phone]. Thank you!

## 2016-11-26 ENCOUNTER — Other Ambulatory Visit: Payer: Self-pay | Admitting: Family

## 2017-02-20 ENCOUNTER — Encounter: Payer: Self-pay | Admitting: Family

## 2017-02-20 ENCOUNTER — Ambulatory Visit (INDEPENDENT_AMBULATORY_CARE_PROVIDER_SITE_OTHER): Payer: BC Managed Care – PPO | Admitting: Family

## 2017-02-20 VITALS — BP 130/82 | HR 63 | Temp 98.3°F | Ht 68.0 in | Wt 140.6 lb

## 2017-02-20 DIAGNOSIS — N952 Postmenopausal atrophic vaginitis: Secondary | ICD-10-CM | POA: Diagnosis not present

## 2017-02-20 DIAGNOSIS — I10 Essential (primary) hypertension: Secondary | ICD-10-CM | POA: Diagnosis not present

## 2017-02-20 MED ORDER — AMLODIPINE BESYLATE 2.5 MG PO TABS: 2.5000 mg | ORAL_TABLET | Freq: Every day | ORAL | 3 refills | Status: DC

## 2017-02-20 MED ORDER — METOPROLOL SUCCINATE ER 50 MG PO TB24: ORAL_TABLET | ORAL | 1 refills | Status: DC

## 2017-02-20 MED ORDER — ESTROGENS, CONJUGATED 0.625 MG/GM VA CREA: TOPICAL_CREAM | VAGINAL | 2 refills | Status: DC

## 2017-02-20 NOTE — Progress Notes (Signed)
Pre visit review using our clinic review tool, if applicable. No additional management support is needed unless otherwise documented below in the visit note. 

## 2017-02-20 NOTE — Progress Notes (Signed)
Subjective:    Patient ID: Natasha Pace, female    DOB: 1956-04-12, 61 y.o.   MRN: [id number]  CC: Natasha Pace is a 61 y.o. female who presents today for follow up.   HPI: Following up on blood pressure . Concerned it has been running high. Thought it was related to life being very busy- recent death of mother.  She's had school nurse check it last week - 164/98. Compliant with her medication. Denies exertional chest pain or pressure, numbness or tingling radiating to left arm or jaw, palpitations, dizziness, frequent headaches, changes in vision, or shortness of breath.    Notes increasing walking routine - 4miles every day now. Paying attention to what she's eating.     HISTORY:  Past Medical History:  Diagnosis Date  . Allergy    hay fever  . Chicken pox   . GERD (gastroesophageal reflux disease)   . Hyperlipidemia   . Hypertension   . Vaginal polyp 2014   benign   Past Surgical History:  Procedure Laterality Date  . INTRAUTERINE DEVICE INSERTION     Removed 2014[facility] [place]  . VAGINAL DELIVERY     2   Family History  Problem Relation Age of Onset  . Hypertension Mother   . Diabetes Maternal Grandmother   . Cancer Maternal Grandmother        breast  . Breast cancer Maternal Grandmother [id number]  . Hypertension Brother   . Hypertension Father   . Diabetes Maternal Grandfather   . Ovarian cancer Other 74       ovarian/uterus, Aunt,     Allergies: Patient has no known allergies. Current Outpatient Prescriptions on File Prior to Visit  Medication Sig Dispense Refill  . atorvastatin (LIPITOR) 20 MG tablet Take 1 tablet (20 mg total) by mouth daily. 90 tablet 2  . cetirizine (ZYRTEC) 10 MG tablet Take 10 mg by mouth daily.    Marland Kitchen. NASONEX 50 MCG/ACT nasal spray   12   No current facility-administered medications on file prior to visit.     Social History  Substance Use Topics  . Smoking status: Never Smoker  . Smokeless tobacco: Never  Used  . Alcohol use Not on file    Review of Systems  Constitutional: Negative for chills and fever.  Respiratory: Negative for cough.   Cardiovascular: Negative for chest pain and palpitations.  Gastrointestinal: Negative for nausea and vomiting.      Objective:    BP 130/82   Pulse 63   Temp 98.3 F (36.8 C) (Oral)   Ht 5\' 8"  (1.727 m)   Wt 140 lb 9.6 oz (63.8 kg)   SpO2 98%   BMI 21.38 kg/m  BP Readings from Last 3 Encounters:  02/20/17 130/82  05/14/16 130/80  04/11/15 118/80   Wt Readings from Last 3 Encounters:  02/20/17 140 lb 9.6 oz (63.8 kg)  05/14/16 144 lb (65.3 kg)  04/11/15 147 lb 8 oz (66.9 kg)    Physical Exam  Constitutional: She appears well-developed and well-nourished.  Eyes: Conjunctivae are normal.  Cardiovascular: Normal rate, regular rhythm, normal heart sounds and normal pulses.   Pulmonary/Chest: Effort normal and breath sounds normal. She has no wheezes. She has no rhonchi. She has no rales.  Neurological: She is alert.  Skin: Skin is warm and dry.  Psychiatric: She has a normal mood and affect. Her speech is normal and behavior is normal. Thought content normal.  Vitals reviewed.  Assessment & Plan:   Problem List Items Addressed This Visit      Cardiovascular and Mediastinum   Hypertension - Primary    At goal today. However values at home have been elevated. Patient has a strong understanding of blood pressure. Given her written prescription for amlodipine 2.5 mg. I advised her to monitor blood pressure and start medication if consistently higher than 130/80. Patient verbalized understanding.      Relevant Medications   conjugated estrogens (PREMARIN) vaginal cream   metoprolol succinate (TOPROL-XL) 50 MG 24 hr tablet   amLODipine (NORVASC) 2.5 MG tablet     Genitourinary   Atrophic vaginitis    Refilled premarin.       Relevant Medications   conjugated estrogens (PREMARIN) vaginal cream       I am having Natasha Pace  start on amLODipine. I am also having her maintain her NASONEX, cetirizine, atorvastatin, conjugated estrogens, and metoprolol succinate.   Meds ordered this encounter  Medications  . conjugated estrogens (PREMARIN) vaginal cream    Sig: 0.5 g intravaginally once per week..    Dispense:  30 g    Refill:  2    Order Specific Question:   Supervising Provider    Answer:   Duncan Dull L [[id number]]  . metoprolol succinate (TOPROL-XL) 50 MG 24 hr tablet    Sig: TAKE 1 TABLET BY MOUTH EVERY DAY (MUST KEEP 03/05/14 APPT)    Dispense:  90 tablet    Refill:  1    Order Specific Question:   Supervising Provider    Answer:   Duncan Dull L [[id number]]  . amLODipine (NORVASC) 2.5 MG tablet    Sig: Take 1 tablet (2.5 mg total) by mouth daily.    Dispense:  90 tablet    Refill:  3    Order Specific Question:   Supervising Provider    Answer:   Sherlene Shams [[id number]]    Return precautions given.   Risks, benefits, and alternatives of the medications and treatment plan prescribed today were discussed, and patient expressed understanding.   Education regarding symptom management and diagnosis given to patient on AVS.  Continue to follow with Allegra Grana, FNP for routine health maintenance.   Natasha Pace and I agreed with plan.   Rennie Plowman, FNP

## 2017-02-20 NOTE — Assessment & Plan Note (Signed)
Refilled premarin. 

## 2017-02-20 NOTE — Assessment & Plan Note (Signed)
At goal today. However values at home have been elevated. Patient has a strong understanding of blood pressure. Given her written prescription for amlodipine 2.5 mg. I advised her to monitor blood pressure and start medication if consistently higher than 130/80. Patient verbalized understanding.

## 2017-02-20 NOTE — Patient Instructions (Signed)
Start amlodipine if needed as discussed  Monitor blood pressure,  Goal is less than 130/80; if persistently higher, please make sooner follow up appointment so we can recheck you blood pressure and manage medications   Pleasure seeing you!

## 2017-04-02 ENCOUNTER — Other Ambulatory Visit: Payer: Self-pay | Admitting: Family

## 2017-06-05 ENCOUNTER — Encounter: Payer: BC Managed Care – PPO | Admitting: Family

## 2017-06-28 ENCOUNTER — Other Ambulatory Visit (HOSPITAL_COMMUNITY)
Admission: RE | Admit: 2017-06-28 | Discharge: 2017-06-28 | Disposition: A | Discharge status: Home / Self Care | Payer: BC Managed Care – PPO | Source: Ambulatory Visit | Attending: Family | Admitting: Family

## 2017-06-28 ENCOUNTER — Encounter: Payer: Self-pay | Admitting: Family

## 2017-06-28 ENCOUNTER — Ambulatory Visit (INDEPENDENT_AMBULATORY_CARE_PROVIDER_SITE_OTHER): Payer: BC Managed Care – PPO | Admitting: Family

## 2017-06-28 VITALS — BP 128/80 | HR 59 | Temp 98.8°F | Ht 68.0 in | Wt 142.2 lb

## 2017-06-28 DIAGNOSIS — I10 Essential (primary) hypertension: Secondary | ICD-10-CM

## 2017-06-28 DIAGNOSIS — Z0000 Encounter for general adult medical examination without abnormal findings: Secondary | ICD-10-CM | POA: Diagnosis not present

## 2017-06-28 DIAGNOSIS — J309 Allergic rhinitis, unspecified: Secondary | ICD-10-CM | POA: Diagnosis not present

## 2017-06-28 DIAGNOSIS — K219 Gastro-esophageal reflux disease without esophagitis: Secondary | ICD-10-CM

## 2017-06-28 MED ORDER — MOMETASONE FUROATE 50 MCG/ACT NA SUSP: 2.0000 | Freq: Every day | NASAL | 12 refills | Status: DC

## 2017-06-28 NOTE — Assessment & Plan Note (Signed)
Improving.  Patient jointly agreed to delay antibiotics.  She will start back on Flonase and let me know if not better

## 2017-06-28 NOTE — Assessment & Plan Note (Signed)
Doing well. Continue current regimen.  

## 2017-06-28 NOTE — Assessment & Plan Note (Signed)
Discussed long-term risk of PPIs.  We also discussed she would need a EGD in the near future.  Patient is aware of the importance of this and will let me know if she is not find control with a histamine blocker.

## 2017-06-28 NOTE — Progress Notes (Signed)
Subjective:    Patient ID: Natasha Pace, female    DOB: 07/23/1955, 62 y.o.   MRN: [id number]  CC: Natasha Pace is a 62 y.o. female who presents today for physical exam.    HPI: Notes congestion x one week,  improving. Endorses productive cough, sinus pressure, tactile fever ( one week ago, resolved).  Taking zyzal.   HTN- compliant with medications and feel like norvasc is helping, although not checking at home. Denies exertional chest pain or pressure, numbness or tingling radiating to left arm or jaw, palpitations, dizziness, frequent headaches, changes in vision, or shortness of breath.   GERD- taking nexuim daily; symptoms controlled. Trigger foods. Doesn't eat late at night. No trouble swallowing, hoarseness, weight loss, cough ( chronic). No h/o smoking.       Colorectal Cancer Screening: UTD 2012, abstracted; per patient 10 year repeat Breast Cancer Screening: Mammogram due Cervical Cancer Screening: due, normal, negative 2016 Bone Health screening/DEXA for 65+: No increased fracture risk. Defer screening at this time. Lung Cancer Screening: Doesn't have 30 year pack year history and age > 55 years       Tetanus - utd          Labs: Screening labs today. Exercise: Gets regular exercise.  Alcohol use: moderate- weekends Smoking/tobacco use: Nonsmoker Regular dental exams: utd Wears seat belt: Yes. No new skin lesion. No ho skin cancer  HISTORY:  Past Medical History:  Diagnosis Date  . Allergy    hay fever  . Chicken pox   . GERD (gastroesophageal reflux disease)   . Hyperlipidemia   . Hypertension   . Vaginal polyp 2014   benign    Past Surgical History:  Procedure Laterality Date  . INTRAUTERINE DEVICE INSERTION     Removed 2014, Mirena, [facility], Melody Normanna  . VAGINAL DELIVERY     2   Family History  Problem Relation Age of Onset  . Hypertension Mother   . Diabetes Maternal Grandmother   . Cancer Maternal Grandmother        breast  .  Breast cancer Maternal Grandmother 65  . Hypertension Brother   . Hypertension Father   . Diabetes Maternal Grandfather   . Ovarian cancer Other 74       ovarian/uterus, Aunt,       ALLERGIES: Patient has no known allergies.  Current Outpatient Medications on File Prior to Visit  Medication Sig Dispense Refill  . amLODipine (NORVASC) 2.5 MG tablet Take 1 tablet (2.5 mg total) by mouth daily. 90 tablet 3  . atorvastatin (LIPITOR) 20 MG tablet TAKE 1 TABLET (20 MG TOTAL) BY MOUTH DAILY. 90 tablet 2  . cetirizine (ZYRTEC) 10 MG tablet Take 10 mg by mouth daily.    Marland Kitchen conjugated estrogens (PREMARIN) vaginal cream 0.5 g intravaginally once per week.. 30 g 2  . metoprolol succinate (TOPROL-XL) 50 MG 24 hr tablet TAKE 1 TABLET BY MOUTH EVERY DAY (MUST KEEP 03/05/14 APPT) 90 tablet 1   No current facility-administered medications on file prior to visit.     Social History   Tobacco Use  . Smoking status: Never Smoker  . Smokeless tobacco: Never Used  Substance Use Topics  . Alcohol use: Not on file  . Drug use: Not on file    Review of Systems  Constitutional: Negative for chills, fever and unexpected weight change.  HENT: Positive for congestion and sinus pressure. Negative for ear pain and sore throat.   Respiratory: Positive for  cough. Negative for shortness of breath and wheezing.   Cardiovascular: Negative for chest pain, palpitations and leg swelling.  Gastrointestinal: Negative for abdominal distention, nausea and vomiting.  Genitourinary: Negative for dysuria and pelvic pain.  Musculoskeletal: Negative for arthralgias and myalgias.  Skin: Negative for rash.  Neurological: Negative for headaches.  Hematological: Negative for adenopathy.  Psychiatric/Behavioral: Negative for confusion.      Objective:    BP 128/80 (BP Location: Left Arm, Patient Position: Sitting, Cuff Size: Normal)   Pulse (!) 59   Temp 98.8 F (37.1 C) (Oral)   Ht 5\' 8"  (1.727 m)   Wt 142 lb 4 oz  (64.5 kg)   SpO2 98%   BMI 21.63 kg/m   BP Readings from Last 3 Encounters:  06/28/17 128/80  02/20/17 130/82  05/14/16 130/80   Wt Readings from Last 3 Encounters:  06/28/17 142 lb 4 oz (64.5 kg)  02/20/17 140 lb 9.6 oz (63.8 kg)  05/14/16 144 lb (65.3 kg)    Physical Exam  Constitutional: She appears well-developed and well-nourished.  HENT:  Head: Normocephalic and atraumatic.  Right Ear: Hearing, tympanic membrane, external ear and ear canal normal. No drainage, swelling or tenderness. No foreign bodies. Tympanic membrane is not erythematous and not bulging. No middle ear effusion. No decreased hearing is noted.  Left Ear: Hearing, tympanic membrane, external ear and ear canal normal. No drainage, swelling or tenderness. No foreign bodies. Tympanic membrane is not erythematous and not bulging.  No middle ear effusion. No decreased hearing is noted.  Nose: Rhinorrhea present. Right sinus exhibits no maxillary sinus tenderness and no frontal sinus tenderness. Left sinus exhibits no maxillary sinus tenderness and no frontal sinus tenderness.  Mouth/Throat: Uvula is midline, oropharynx is clear and moist and mucous membranes are normal. No oropharyngeal exudate, posterior oropharyngeal edema, posterior oropharyngeal erythema or tonsillar abscesses.  Eyes: Conjunctivae are normal.  Neck: No thyroid mass and no thyromegaly present.  Cardiovascular: Normal rate, regular rhythm, normal heart sounds and normal pulses.  Pulmonary/Chest: Effort normal and breath sounds normal. She has no wheezes. She has no rhonchi. She has no rales. Right breast exhibits no inverted nipple, no mass, no nipple discharge, no skin change and no tenderness. Left breast exhibits no inverted nipple, no mass, no nipple discharge, no skin change and no tenderness. Breasts are symmetrical.  No masses or asymmetry appreciated during CBE.  Genitourinary: Uterus is not enlarged, not fixed and not tender. Cervix exhibits  no motion tenderness, no discharge and no friability. Right adnexum displays no mass, no tenderness and no fullness. Left adnexum displays no mass, no tenderness and no fullness.  Genitourinary Comments: Pap performed. No CMT. Unable to appreciated ovaries.  Lymphadenopathy:       Head (right side): No submental, no submandibular, no tonsillar, no preauricular, no posterior auricular and no occipital adenopathy present.       Head (left side): No submental, no submandibular, no tonsillar, no preauricular, no posterior auricular and no occipital adenopathy present.    She has no cervical adenopathy.       Right cervical: No superficial cervical, no deep cervical and no posterior cervical adenopathy present.      Left cervical: No superficial cervical, no deep cervical and no posterior cervical adenopathy present.    She has no axillary adenopathy.       Right axillary: No pectoral and no lateral adenopathy present.       Left axillary: No pectoral and no lateral adenopathy present.  Neurological: She is alert.  Skin: Skin is warm and dry.  Psychiatric: She has a normal mood and affect. Her speech is normal and behavior is normal. Thought content normal.  Vitals reviewed.      Assessment & Plan:   Problem List Items Addressed This Visit      Cardiovascular and Mediastinum   Hypertension    Doing well.  Continue current regimen        Respiratory   Allergic rhinitis    Improving.  Patient jointly agreed to delay antibiotics.  She will start back on Flonase and let me know if not better      Relevant Medications   mometasone (NASONEX) 50 MCG/ACT nasal spray     Digestive   GERD (gastroesophageal reflux disease)    Discussed long-term risk of PPIs.  We also discussed she would need a EGD in the near future.  Patient is aware of the importance of this and will let me know if she is not find control with a histamine blocker.        Other   Routine general medical examination at a  health care facility - Primary    cbe and pap performed. She will schedule mammogram. Advised shingrex      Relevant Orders   CBC with Differential/Platelet   Comprehensive metabolic panel   Hemoglobin A1c   Lipid panel   TSH   VITAMIN D 25 Hydroxy (Vit-D Deficiency, Fractures)   MM SCREENING BREAST TOMO BILATERAL   Cytology - PAP       I have changed Birdie Hopes. Romero's NASONEX to mometasone. I am also having her maintain her cetirizine, conjugated estrogens, metoprolol succinate, amLODipine, and atorvastatin.   Meds ordered this encounter  Medications  . mometasone (NASONEX) 50 MCG/ACT nasal spray    Sig: Place 2 sprays into the nose daily.    Dispense:  17 g    Refill:  12    Order Specific Question:   Supervising Provider    Answer:   Sherlene Shams [[id number]]    Return precautions given.   Risks, benefits, and alternatives of the medications and treatment plan prescribed today were discussed, and patient expressed understanding.   Education regarding symptom management and diagnosis given to patient on AVS.   Continue to follow with Allegra Grana, FNP for routine health maintenance.   Trixie Rude and I agreed with plan.   Rennie Plowman, FNP

## 2017-06-28 NOTE — Assessment & Plan Note (Signed)
cbe and pap performed. She will schedule mammogram. Advised shingrex

## 2017-06-28 NOTE — Patient Instructions (Addendum)
Guafensin is fine. NOT decongestant. No mucinex- D or mucinex -DM.   Let me know if cough doesn't continue to improve  May also do shingrex vaccine.   Long term use beyond 3 months of proton pump inhibitors , also called PPI's, is associated with malabsorption of vitamins, chronic kidney disease, fracture risk, and diarrheal illnesses. PPI's include Nexium, Prilosec, Protonix, Dexilant, and Prevacid.   I generally recommend trying to control acid reflux with lifestyle modifications including avoiding trigger foods, not eating 2 hours prior to bedtime. You may use histamine 2 blockers daily to twice daily ( this is Zantac, Pepcid) and then when symptoms flare, start back on PPI for short course.   If no improvement on zantac, let me know as would advise earlier upper scope.   Again, an upper scope would be appropriate now or with colonoscopy.  We placed a referral for mammogram this year. I asked that you call one the below locations and schedule this when it is convenient for you.   As discussed, I would like you to ask for 3D mammogram over the traditional 2D mammogram as new evidence suggest 3D is superior.   Please note that NOT all insurance companies cover 3D and you may have to pay a higher copay. You may call your insurance company to further clarify your benefits.   Options for [place]  [place], [place]  * [facility] [place], [place] * Note if you ask for 3D mammogram at this location, you must request [place], [place] location*   Health Maintenance, Female Adopting a healthy lifestyle and getting preventive care can go a long way to promote health and wellness. Talk with your health care provider about what schedule of regular examinations is right for you. This is a good chance for you to check in with your provider about  disease prevention and staying healthy. In between checkups, there are plenty of things you can do on your own. Experts have done a lot of research about which lifestyle changes and preventive measures are most likely to keep you healthy. Ask your health care provider for more information. Weight and diet Eat a healthy diet  Be sure to include plenty of vegetables, fruits, low-fat dairy products, and lean protein.  Do not eat a lot of foods high in solid fats, added sugars, or salt.  Get regular exercise. This is one of the most important things you can do for your health. ? Most adults should exercise for at least 150 minutes each week. The exercise should increase your heart rate and make you sweat (moderate-intensity exercise). ? Most adults should also do strengthening exercises at least twice a week. This is in addition to the moderate-intensity exercise.  Maintain a healthy weight  Body mass index (BMI) is a measurement that can be used to identify possible weight problems. It estimates body fat based on height and weight. Your health care provider can help determine your BMI and help you achieve or maintain a healthy weight.  For females 64 years of age and older: ? A BMI below 18.5 is considered underweight. ? A BMI of 18.5 to 24.9 is normal. ? A BMI of 25 to 29.9 is considered overweight. ? A BMI of 30 and above is considered obese.  Watch levels of cholesterol and blood lipids  You should start having  your blood tested for lipids and cholesterol at 60 years of age, then have this test every 5 years.  You may need to have your cholesterol levels checked more often if: ? Your lipid or cholesterol levels are high. ? You are older than 60 years of age. ? You are at high risk for heart disease.  Cancer screening Lung Cancer  Lung cancer screening is recommended for adults 27-34 years old who are at high risk for lung cancer because of a history of smoking.  A yearly low-dose  CT scan of the lungs is recommended for people who: ? Currently smoke. ? Have quit within the past 15 years. ? Have at least a 30-pack-year history of smoking. A pack year is smoking an average of one pack of cigarettes a day for 1 year.  Yearly screening should continue until it has been 15 years since you quit.  Yearly screening should stop if you develop a health problem that would prevent you from having lung cancer treatment.  Breast Cancer  Practice breast self-awareness. This means understanding how your breasts normally appear and feel.  It also means doing regular breast self-exams. Let your health care provider know about any changes, no matter how small.  If you are in your 20s or 30s, you should have a clinical breast exam (CBE) by a health care provider every 1-3 years as part of a regular health exam.  If you are 38 or older, have a CBE every year. Also consider having a breast X-ray (mammogram) every year.  If you have a family history of breast cancer, talk to your health care provider about genetic screening.  If you are at high risk for breast cancer, talk to your health care provider about having an MRI and a mammogram every year.  Breast cancer gene (BRCA) assessment is recommended for women who have family members with BRCA-related cancers. BRCA-related cancers include: ? Breast. ? Ovarian. ? Tubal. ? Peritoneal cancers.  Results of the assessment will determine the need for genetic counseling and BRCA1 and BRCA2 testing.  Cervical Cancer Your health care provider may recommend that you be screened regularly for cancer of the pelvic organs (ovaries, uterus, and vagina). This screening involves a pelvic examination, including checking for microscopic changes to the surface of your cervix (Pap test). You may be encouraged to have this screening done every 3 years, beginning at age 48.  For women ages 32-65, health care providers may recommend pelvic exams and Pap  testing every 3 years, or they may recommend the Pap and pelvic exam, combined with testing for human papilloma virus (HPV), every 5 years. Some types of HPV increase your risk of cervical cancer. Testing for HPV may also be done on women of any age with unclear Pap test results.  Other health care providers may not recommend any screening for nonpregnant women who are considered low risk for pelvic cancer and who do not have symptoms. Ask your health care provider if a screening pelvic exam is right for you.  If you have had past treatment for cervical cancer or a condition that could lead to cancer, you need Pap tests and screening for cancer for at least 20 years after your treatment. If Pap tests have been discontinued, your risk factors (such as having a new sexual partner) need to be reassessed to determine if screening should resume. Some women have medical problems that increase the chance of getting cervical cancer. In these cases, your health care  provider may recommend more frequent screening and Pap tests.  Colorectal Cancer  This type of cancer can be detected and often prevented.  Routine colorectal cancer screening usually begins at 60 years of age and continues through 60 years of age.  Your health care provider may recommend screening at an earlier age if you have risk factors for colon cancer.  Your health care provider may also recommend using home test kits to check for hidden blood in the stool.  A small camera at the end of a tube can be used to examine your colon directly (sigmoidoscopy or colonoscopy). This is done to check for the earliest forms of colorectal cancer.  Routine screening usually begins at age 77.  Direct examination of the colon should be repeated every 5-10 years through 60 years of age. However, you may need to be screened more often if early forms of precancerous polyps or small growths are found.  Skin Cancer  Check your skin from head to toe  regularly.  Tell your health care provider about any new moles or changes in moles, especially if there is a change in a mole's shape or color.  Also tell your health care provider if you have a mole that is larger than the size of a pencil eraser.  Always use sunscreen. Apply sunscreen liberally and repeatedly throughout the day.  Protect yourself by wearing long sleeves, pants, a wide-brimmed hat, and sunglasses whenever you are outside.  Heart disease, diabetes, and high blood pressure  High blood pressure causes heart disease and increases the risk of stroke. High blood pressure is more likely to develop in: ? People who have blood pressure in the high end of the normal range (130-139/85-89 mm Hg). ? People who are overweight or obese. ? People who are African American.  If you are 89-41 years of age, have your blood pressure checked every 3-5 years. If you are 74 years of age or older, have your blood pressure checked every year. You should have your blood pressure measured twice-once when you are at a hospital or clinic, and once when you are not at a hospital or clinic. Record the average of the two measurements. To check your blood pressure when you are not at a hospital or clinic, you can use: ? An automated blood pressure machine at a pharmacy. ? A home blood pressure monitor.  If you are between 62 years and 59 years old, ask your health care provider if you should take aspirin to prevent strokes.  Have regular diabetes screenings. This involves taking a blood sample to check your fasting blood sugar level. ? If you are at a normal weight and have a low risk for diabetes, have this test once every three years after 60 years of age. ? If you are overweight and have a high risk for diabetes, consider being tested at a younger age or more often. Preventing infection Hepatitis B  If you have a higher risk for hepatitis B, you should be screened for this virus. You are considered  at high risk for hepatitis B if: ? You were born in a country where hepatitis B is common. Ask your health care provider which countries are considered high risk. ? Your parents were born in a high-risk country, and you have not been immunized against hepatitis B (hepatitis B vaccine). ? You have HIV or AIDS. ? You use needles to inject street drugs. ? You live with someone who has hepatitis B. ? You  have had sex with someone who has hepatitis B. ? You get hemodialysis treatment. ? You take certain medicines for conditions, including cancer, organ transplantation, and autoimmune conditions.  Hepatitis C  Blood testing is recommended for: ? Everyone born from [birth year] through 1963. ? Anyone with known risk factors for hepatitis C.  Sexually transmitted infections (STIs)  You should be screened for sexually transmitted infections (STIs) including gonorrhea and chlamydia if: ? You are sexually active and are younger than 60 years of age. ? You are older than 60 years of age and your health care provider tells you that you are at risk for this type of infection. ? Your sexual activity has changed since you were last screened and you are at an increased risk for chlamydia or gonorrhea. Ask your health care provider if you are at risk.  If you do not have HIV, but are at risk, it may be recommended that you take a prescription medicine daily to prevent HIV infection. This is called pre-exposure prophylaxis (PrEP). You are considered at risk if: ? You are sexually active and do not regularly use condoms or know the HIV status of your partner(s). ? You take drugs by injection. ? You are sexually active with a partner who has HIV.  Talk with your health care provider about whether you are at high risk of being infected with HIV. If you choose to begin PrEP, you should first be tested for HIV. You should then be tested every 3 months for as long as you are taking PrEP. Pregnancy  If you are  premenopausal and you may become pregnant, ask your health care provider about preconception counseling.  If you may become pregnant, take 400 to 800 micrograms (mcg) of folic acid every day.  If you want to prevent pregnancy, talk to your health care provider about birth control (contraception). Osteoporosis and menopause  Osteoporosis is a disease in which the bones lose minerals and strength with aging. This can result in serious bone fractures. Your risk for osteoporosis can be identified using a bone density scan.  If you are 44 years of age or older, or if you are at risk for osteoporosis and fractures, ask your health care provider if you should be screened.  Ask your health care provider whether you should take a calcium or vitamin D supplement to lower your risk for osteoporosis.  Menopause may have certain physical symptoms and risks.  Hormone replacement therapy may reduce some of these symptoms and risks. Talk to your health care provider about whether hormone replacement therapy is right for you. Follow these instructions at home:  Schedule regular health, dental, and eye exams.  Stay current with your immunizations.  Do not use any tobacco products including cigarettes, chewing tobacco, or electronic cigarettes.  If you are pregnant, do not drink alcohol.  If you are breastfeeding, limit how much and how often you drink alcohol.  Limit alcohol intake to no more than 1 drink per day for nonpregnant women. One drink equals 12 ounces of beer, 5 ounces of wine, or 1 ounces of hard liquor.  Do not use street drugs.  Do not share needles.  Ask your health care provider for help if you need support or information about quitting drugs.  Tell your health care provider if you often feel depressed.  Tell your health care provider if you have ever been abused or do not feel safe at home. This information is not intended to replace advice given  to you by your health care  provider. Make sure you discuss any questions you have with your health care provider. Document Released: 10/23/2010 Document Revised: 09/15/2015 Document Reviewed: 01/11/2015 Elsevier Interactive Patient Education  [employer].

## 2017-07-01 LAB — CYTOLOGY - PAP
DIAGNOSIS: NEGATIVE
HPV: NOT DETECTED

## 2017-07-03 ENCOUNTER — Other Ambulatory Visit (INDEPENDENT_AMBULATORY_CARE_PROVIDER_SITE_OTHER): Payer: BC Managed Care – PPO

## 2017-07-03 DIAGNOSIS — Z0000 Encounter for general adult medical examination without abnormal findings: Secondary | ICD-10-CM | POA: Diagnosis not present

## 2017-07-03 LAB — CBC WITH DIFFERENTIAL/PLATELET
BASOS PCT: 0.5 % (ref 0.0–3.0)
Basophils Absolute: 0 10*3/uL (ref 0.0–0.1)
EOS ABS: 0.1 10*3/uL (ref 0.0–0.7)
Eosinophils Relative: 2.3 % (ref 0.0–5.0)
HEMATOCRIT: 38.7 % (ref 36.0–46.0)
Hemoglobin: 13.2 g/dL (ref 12.0–15.0)
LYMPHS PCT: 42.9 % (ref 12.0–46.0)
Lymphs Abs: 2.7 10*3/uL (ref 0.7–4.0)
MCHC: 34 g/dL (ref 30.0–36.0)
MCV: 87.8 fl (ref 78.0–100.0)
Monocytes Absolute: 0.5 10*3/uL (ref 0.1–1.0)
Monocytes Relative: 8.1 % (ref 3.0–12.0)
NEUTROS ABS: 2.9 10*3/uL (ref 1.4–7.7)
Neutrophils Relative %: 46.2 % (ref 43.0–77.0)
PLATELETS: 276 10*3/uL (ref 150.0–400.0)
RBC: 4.41 Mil/uL (ref 3.87–5.11)
RDW: 13.2 % (ref 11.5–15.5)
WBC: 6.3 10*3/uL (ref 4.0–10.5)

## 2017-07-03 LAB — COMPREHENSIVE METABOLIC PANEL
ALT: 22 U/L (ref 0–35)
AST: 20 U/L (ref 0–37)
Albumin: 4.2 g/dL (ref 3.5–5.2)
Alkaline Phosphatase: 57 U/L (ref 39–117)
BUN: 15 mg/dL (ref 6–23)
CALCIUM: 9.8 mg/dL (ref 8.4–10.5)
CHLORIDE: 102 meq/L (ref 96–112)
CO2: 30 mEq/L (ref 19–32)
CREATININE: 0.79 mg/dL (ref 0.40–1.20)
GFR: 79.02 mL/min (ref >60.00)
Glucose, Bld: 93 mg/dL (ref 70–99)
Potassium: 4.2 mEq/L (ref 3.5–5.1)
SODIUM: 138 meq/L (ref 135–145)
Total Bilirubin: 0.8 mg/dL (ref 0.2–1.2)
Total Protein: 7.5 g/dL (ref 6.0–8.3)

## 2017-07-03 LAB — HEMOGLOBIN A1C: HEMOGLOBIN A1C: 5.8 % (ref 4.6–6.5)

## 2017-07-03 LAB — TSH: TSH: 2.2 u[IU]/mL (ref 0.35–4.50)

## 2017-07-03 LAB — LIPID PANEL
CHOL/HDL RATIO: 3
Cholesterol: 172 mg/dL (ref 0–200)
HDL: 51.3 mg/dL (ref >39.00)
LDL Cholesterol: 96 mg/dL (ref 0–99)
NONHDL: 120.31
Triglycerides: 123 mg/dL (ref 0.0–149.0)
VLDL: 24.6 mg/dL (ref 0.0–40.0)

## 2017-07-03 LAB — VITAMIN D 25 HYDROXY (VIT D DEFICIENCY, FRACTURES): VITD: 27.13 ng/mL — AB (ref 30.00–100.00)

## 2017-07-15 ENCOUNTER — Ambulatory Visit
Admission: RE | Admit: 2017-07-15 | Discharge: 2017-07-15 | Disposition: A | Discharge status: Home / Self Care | Payer: BC Managed Care – PPO | Source: Ambulatory Visit | Attending: Family | Admitting: Family

## 2017-07-15 DIAGNOSIS — Z0000 Encounter for general adult medical examination without abnormal findings: Secondary | ICD-10-CM

## 2017-07-15 DIAGNOSIS — Z1231 Encounter for screening mammogram for malignant neoplasm of breast: Secondary | ICD-10-CM | POA: Insufficient documentation

## 2017-08-15 ENCOUNTER — Other Ambulatory Visit: Payer: Self-pay | Admitting: Family

## 2017-08-15 DIAGNOSIS — I10 Essential (primary) hypertension: Secondary | ICD-10-CM

## 2017-11-19 ENCOUNTER — Encounter: Payer: Self-pay | Admitting: Family Medicine

## 2017-11-19 ENCOUNTER — Ambulatory Visit: Payer: BC Managed Care – PPO | Admitting: Family Medicine

## 2017-11-19 VITALS — BP 134/82 | HR 67 | Temp 98.0°F | Resp 14 | Wt 140.5 lb

## 2017-11-19 DIAGNOSIS — I10 Essential (primary) hypertension: Secondary | ICD-10-CM | POA: Diagnosis not present

## 2017-11-19 NOTE — Patient Instructions (Signed)
Please monitor blood pressure daily, document readings, and follow up for further evaluation of your blood pressure.   Next step is likely to increase amlodipine to 5 mg daily however this can be determined after a recheck of your blood pressure.  Minimal Blood Pressure Goal= AVERAGE < 140/90; Ideal is an AVERAGE < 135/85. This AVERAGE should be calculated from @ least 5-7 BP readings taken @ different times of day on different days of week. You should not respond to isolated BP readings , but rather the AVERAGE for that week .Please bring your blood pressure cuff to office visits to verify that it is reliable.It can also be checked against the blood pressure device at the pharmacy. Finger or wrist cuffs are not dependable; an arm cuff is.   Great job with water intake and exercise! Continue focusing on monitoring your salt intake. See information below regarding DASH dietary recommendations.   DASH Eating Plan DASH stands for "Dietary Approaches to Stop Hypertension." The DASH eating plan is a healthy eating plan that has been shown to reduce high blood pressure (hypertension). It may also reduce your risk for type 2 diabetes, heart disease, and stroke. The DASH eating plan may also help with weight loss. What are tips for following this plan? General guidelines  Avoid eating more than 2,300 mg (milligrams) of salt (sodium) a day. If you have hypertension, you may need to reduce your sodium intake to 1,500 mg a day.  Limit alcohol intake to no more than 1 drink a day for nonpregnant women and 2 drinks a day for men. One drink equals 12 oz of beer, 5 oz of wine, or 1 oz of hard liquor.  Work with your health care provider to maintain a healthy body weight or to lose weight. Ask what an ideal weight is for you.  Get at least 30 minutes of exercise that causes your heart to beat faster (aerobic exercise) most days of the week. Activities may include walking, swimming, or biking.  Work with  your health care provider or diet and nutrition specialist (dietitian) to adjust your eating plan to your individual calorie needs. Reading food labels  Check food labels for the amount of sodium per serving. Choose foods with less than 5 percent of the Daily Value of sodium. Generally, foods with less than 300 mg of sodium per serving fit into this eating plan.  To find whole grains, look for the word "whole" as the first word in the ingredient list. Shopping  Buy products labeled as "low-sodium" or "no salt added."  Buy fresh foods. Avoid canned foods and premade or frozen meals. Cooking  Avoid adding salt when cooking. Use salt-free seasonings or herbs instead of table salt or sea salt. Check with your health care provider or pharmacist before using salt substitutes.  Do not fry foods. Cook foods using healthy methods such as baking, boiling, grilling, and broiling instead.  Cook with heart-healthy oils, such as olive, canola, soybean, or sunflower oil. Meal planning   Eat a balanced diet that includes: ? 5 or more servings of fruits and vegetables each day. At each meal, try to fill half of your plate with fruits and vegetables. ? Up to 6-8 servings of whole grains each day. ? Less than 6 oz of lean meat, poultry, or fish each day. A 3-oz serving of meat is about the same size as a deck of cards. One egg equals 1 oz. ? 2 servings of low-fat dairy each day. ?  A serving of nuts, seeds, or beans 5 times each week. ? Heart-healthy fats. Healthy fats called Omega-3 fatty acids are found in foods such as flaxseeds and coldwater fish, like sardines, salmon, and mackerel.  Limit how much you eat of the following: ? Canned or prepackaged foods. ? Food that is high in trans fat, such as fried foods. ? Food that is high in saturated fat, such as fatty meat. ? Sweets, desserts, sugary drinks, and other foods with added sugar. ? Full-fat dairy products.  Do not salt foods before  eating.  Try to eat at least 2 vegetarian meals each week.  Eat more home-cooked food and less restaurant, buffet, and fast food.  When eating at a restaurant, ask that your food be prepared with less salt or no salt, if possible. What foods are recommended? The items listed may not be a complete list. Talk with your dietitian about what dietary choices are best for you. Grains Whole-grain or whole-wheat bread. Whole-grain or whole-wheat pasta. Brown rice. [place]. Bulgur. Whole-grain and low-sodium cereals. Pita bread. Low-fat, low-sodium crackers. Whole-wheat flour tortillas. Vegetables Fresh or frozen vegetables (raw, steamed, roasted, or grilled). Low-sodium or reduced-sodium tomato and vegetable juice. Low-sodium or reduced-sodium tomato sauce and tomato paste. Low-sodium or reduced-sodium canned vegetables. Fruits All fresh, dried, or frozen fruit. Canned fruit in natural juice (without added sugar). Meat and other protein foods Skinless chicken or [place]. Ground chicken or [place]. Pork with fat trimmed off. Fish and seafood. Egg whites. Dried beans, peas, or lentils. Unsalted nuts, nut butters, and seeds. Unsalted canned beans. Lean cuts of beef with fat trimmed off. Low-sodium, lean deli meat. Dairy Low-fat (1%) or fat-free (skim) milk. Fat-free, low-fat, or reduced-fat cheeses. Nonfat, low-sodium ricotta or cottage cheese. Low-fat or nonfat yogurt. Low-fat, low-sodium cheese. Fats and oils Soft margarine without trans fats. Vegetable oil. Low-fat, reduced-fat, or light mayonnaise and salad dressings (reduced-sodium). Canola, safflower, olive, soybean, and sunflower oils. Avocado. Seasoning and other foods Herbs. Spices. Seasoning mixes without salt. Unsalted popcorn and pretzels. Fat-free sweets. What foods are not recommended? The items listed may not be a complete list. Talk with your dietitian about what dietary choices are best for you. Grains Baked goods made with fat,  such as croissants, muffins, or some breads. Dry pasta or rice meal packs. Vegetables Creamed or fried vegetables. Vegetables in a cheese sauce. Regular canned vegetables (not low-sodium or reduced-sodium). Regular canned tomato sauce and paste (not low-sodium or reduced-sodium). Regular tomato and vegetable juice (not low-sodium or reduced-sodium). Angie Fava. Olives. Fruits Canned fruit in a light or heavy syrup. Fried fruit. Fruit in cream or butter sauce. Meat and other protein foods Fatty cuts of meat. Ribs. Fried meat. Berniece Salines. Sausage. Bologna and other processed lunch meats. Salami. Fatback. Hotdogs. Bratwurst. Salted nuts and seeds. Canned beans with added salt. Canned or smoked fish. Whole eggs or egg yolks. Chicken or [place] with skin. Dairy Whole or 2% milk, cream, and half-and-half. Whole or full-fat cream cheese. Whole-fat or sweetened yogurt. Full-fat cheese. Nondairy creamers. Whipped toppings. Processed cheese and cheese spreads. Fats and oils Butter. Stick margarine. Lard. Shortening. Ghee. Bacon fat. Tropical oils, such as coconut, palm kernel, or palm oil. Seasoning and other foods Salted popcorn and pretzels. Onion salt, garlic salt, seasoned salt, table salt, and sea salt. Worcestershire sauce. Tartar sauce. Barbecue sauce. Teriyaki sauce. Soy sauce, including reduced-sodium. Steak sauce. Canned and packaged gravies. Fish sauce. Oyster sauce. Cocktail sauce. Horseradish that you find on the shelf. Ketchup.  Mustard. Meat flavorings and tenderizers. Bouillon cubes. Hot sauce and Tabasco sauce. Premade or packaged marinades. Premade or packaged taco seasonings. Relishes. Regular salad dressings. Where to find more information:  National Heart, Lung, and Blood Institute: PopSteam.is  American Heart Association: www.heart.org Summary  The DASH eating plan is a healthy eating plan that has been shown to reduce high blood pressure (hypertension). It may also reduce your risk for  type 2 diabetes, heart disease, and stroke.  With the DASH eating plan, you should limit salt (sodium) intake to 2,300 mg a day. If you have hypertension, you may need to reduce your sodium intake to 1,500 mg a day.  When on the DASH eating plan, aim to eat more fresh fruits and vegetables, whole grains, lean proteins, low-fat dairy, and heart-healthy fats.  Work with your health care provider or diet and nutrition specialist (dietitian) to adjust your eating plan to your individual calorie needs. This information is not intended to replace advice given to you by your health care provider. Make sure you discuss any questions you have with your health care provider. Document Released: 03/29/2011 Document Revised: 04/02/2016 Document Reviewed: 04/02/2016 Elsevier Interactive Patient Education  [employer].

## 2017-11-19 NOTE — Progress Notes (Signed)
Subjective:    Patient ID: Natasha Pace, female    DOB: 1955/08/29, 62 y.o.   MRN: [id number]  HPI  Natasha Pace is a 62 year old female who presents today for evaluation of her BP. She reports that over the past 1 to 1 1/2 weeks, she has "felt" that her BP has been elevated. She does not monitor her BP at home.  She states that she can sit still and feels that she cannot relax which is what has been present previously when her BP was elevated. She reports feeling her "heart beat" but denies palpations, chest pain.  She denies chest pain, palpitations, SOB, numbness, tingling, weakness, headaches, changes in vision or edema. She reports drinking water daily and will have an "occasional" unsweet tea.  She states that she may have increased salt by eating out at restaurants recently.  Walks about 4 miles daily and denies cardiopulmonary symptoms  She is her adherent with medication regimen amlodipine and metoprolol daily.   Review of Systems  Constitutional: Negative for chills, fatigue and fever.  Eyes: Negative for visual disturbance.  Respiratory: Negative for cough, shortness of breath and wheezing.   Cardiovascular: Negative for chest pain and palpitations.  Gastrointestinal: Negative for abdominal pain.  Musculoskeletal: Negative for myalgias.  Skin: Negative for rash.  Neurological: Negative for dizziness, weakness, light-headedness and headaches.  Psychiatric/Behavioral:       Denies depressed or anxious mood today   Past Medical History:  Diagnosis Date  . Allergy    hay fever  . Chicken pox   . GERD (gastroesophageal reflux disease)   . Hyperlipidemia   . Hypertension   . Vaginal polyp 2014   benign     Social History   Socioeconomic History  . Marital status: Married    Spouse name: Not on file  . Number of children: Not on file  . Years of education: Not on file  . Highest education level: Not on file  Occupational History  . Not on file  Social Needs  .  Financial resource strain: Not on file  . Food insecurity:    Worry: Not on file    Inability: Not on file  . Transportation needs:    Medical: Not on file    Non-medical: Not on file  Tobacco Use  . Smoking status: Never Smoker  . Smokeless tobacco: Never Used  Substance and Sexual Activity  . Alcohol use: Not on file  . Drug use: Not on file  . Sexual activity: Not on file  Lifestyle  . Physical activity:    Days per week: Not on file    Minutes per session: Not on file  . Stress: Not on file  Relationships  . Social connections:    Talks on phone: Not on file    Gets together: Not on file    Attends religious service: Not on file    Active member of club or organization: Not on file    Attends meetings of clubs or organizations: Not on file    Relationship status: Not on file  . Intimate partner violence:    Fear of current or ex partner: Not on file    Emotionally abused: Not on file    Physically abused: Not on file    Forced sexual activity: Not on file  Other Topics Concern  . Not on file  Social History Narrative   Lives in [place] with [place]. 2 girls in college.  Work - Surveyor, mining at [employer]   Diet - healthy   Exercise - walks 3-4 miles per day    Past Surgical History:  Procedure Laterality Date  . INTRAUTERINE DEVICE INSERTION     Removed 2014, Mirena, [facility], [place]  . VAGINAL DELIVERY     2    Family History  Problem Relation Age of Onset  . Hypertension Mother   . Diabetes Maternal Grandmother   . Cancer Maternal Grandmother        breast  . Breast cancer Maternal Grandmother 51  . Hypertension Brother   . Hypertension Father   . Diabetes Maternal Grandfather   . Ovarian cancer Other 74       ovarian/uterus, Aunt,     No Known Allergies  Current Outpatient Medications on File Prior to Visit  Medication Sig Dispense Refill  . amLODipine (NORVASC) 2.5 MG tablet Take 1 tablet (2.5 mg total)  by mouth daily. 90 tablet 3  . atorvastatin (LIPITOR) 20 MG tablet TAKE 1 TABLET (20 MG TOTAL) BY MOUTH DAILY. 90 tablet 2  . cetirizine (ZYRTEC) 10 MG tablet Take 10 mg by mouth daily.    . Cholecalciferol (VITAMIN D3) 1000 units CAPS Take by mouth daily.    Marland Kitchen conjugated estrogens (PREMARIN) vaginal cream 0.5 g intravaginally once per week.. 30 g 2  . metoprolol succinate (TOPROL-XL) 50 MG 24 hr tablet TAKE 1 TABLET BY MOUTH EVERY DAY (MUST KEEP 03/05/14 APPT) 90 tablet 1  . mometasone (NASONEX) 50 MCG/ACT nasal spray Place 2 sprays into the nose daily. 17 g 12  . Multiple Vitamins-Minerals (WOMENS MULTIVITAMIN) TABS Take by mouth daily.     No current facility-administered medications on file prior to visit.     BP 134/82 (BP Location: Right Arm)   Pulse 67   Temp 98 F (36.7 C) (Oral)   Resp 14   Wt 140 lb 8 oz (63.7 kg)   SpO2 99%   BMI 21.36 kg/m       Objective:   Physical Exam  Constitutional: She is oriented to person, place, and time. She appears well-developed and well-nourished.  Eyes: Pupils are equal, round, and reactive to light. No scleral icterus.  Neck: Neck supple.  Cardiovascular: Normal rate, regular rhythm, normal heart sounds and intact distal pulses.  Pulmonary/Chest: Effort normal and breath sounds normal. She has no wheezes. She has no rales.  Abdominal: Soft. Bowel sounds are normal. There is no tenderness.  Musculoskeletal: She exhibits no edema.  Lymphadenopathy:    She has no cervical adenopathy.  Neurological: She is alert and oriented to person, place, and time.  Skin: Skin is warm and dry. Capillary refill takes less than 2 seconds. No erythema.  Psychiatric: She has a normal mood and affect. Her behavior is normal. Judgment and thought content normal.          Assessment & Plan:  1. Hypertension, unspecified type Controlled during this visit. She does not monitor her BP at home therefore no elevated readings documented. Advised her to  initiate monitoring and the importance of monitoring when taking meds for BP. Discussed monitoring BP once daily, document readings, and follow up in 2 weeks for a BP recheck with PCP; advised her to bring her cuff. Further advised her the importance of monitoring salt in her diet and provided DASH recommendations.  We discussed that next option if elevated BP average, may be increase in amlodipine to 5 mg daily.  Further advised her to let us know sooner if BP average is >140/90. She voiced understanding and agreed with plan.  Return precautions provided.  Roddie Mc, FNP-C

## 2017-11-21 ENCOUNTER — Encounter: Payer: Self-pay | Admitting: Family Medicine

## 2017-12-18 ENCOUNTER — Ambulatory Visit: Payer: BC Managed Care – PPO | Admitting: Family

## 2017-12-18 VITALS — BP 118/76 | HR 60 | Temp 98.2°F | Resp 15 | Wt 138.1 lb

## 2017-12-18 DIAGNOSIS — R002 Palpitations: Secondary | ICD-10-CM

## 2017-12-18 DIAGNOSIS — I10 Essential (primary) hypertension: Secondary | ICD-10-CM

## 2017-12-18 NOTE — Patient Instructions (Signed)
Lets monitor blood pressure as you are doing  Let me know if palpitations persist and certainly if you think anxiety playing a role

## 2017-12-18 NOTE — Assessment & Plan Note (Signed)
At goal. Will continue current regimen.  

## 2017-12-18 NOTE — Assessment & Plan Note (Signed)
Intermittent.  Improved.  Patient and I  discussed at length anxiety may be contributing due to her recent retirement and figuring out new routine.  No caffeine use, normal TSH, CBC couple months ago.  EKG today is reassuring.  Normal sinus rhythm, no ischemia or significant changes compared to prior EKG 2014.  Advised patient let me know if recurs and certainly if persists.  Patient will stay vigilant

## 2017-12-18 NOTE — Progress Notes (Signed)
Subjective:    Patient ID: Natasha Pace, female    DOB: [birth year], 62 y.o.   MRN: [id number]  CC: Natasha Pace is a 62 y.o. female who presents today for follow up.   HPI: HTN- on 5 mg amlodipine for 4 weeks when seen in the office.   Has noticed when BP was high, has improved, since increase of 5mg  amlodipine. None today. Felt that HR was irregular, 'split second.' Retired in June, considered anxiety as develops new routine. No depression. No caffeine. No depression.  Walks 5 miles every day, no CP during exertion.   Denies exertional chest pain or pressure, numbness or tingling radiating to left arm or jaw,  dizziness, frequent headaches, changes in vision, cough, or shortness of breath.   Normal stress test per patient 2014- unable to see    HISTORY:  Past Medical History:  Diagnosis Date  . Allergy    hay fever  . Chicken pox   . GERD (gastroesophageal reflux disease)   . Hyperlipidemia   . Hypertension   . Vaginal polyp 2014   benign   Past Surgical History:  Procedure Laterality Date  . INTRAUTERINE DEVICE INSERTION     Removed 2014, Mirena, [facility], [place]  . VAGINAL DELIVERY     2   Family History  Problem Relation Age of Onset  . Hypertension Mother   . Diabetes Maternal Grandmother   . Cancer Maternal Grandmother        breast  . Breast cancer Maternal Grandmother 89  . Hypertension Brother   . Hypertension Father   . Diabetes Maternal Grandfather   . Ovarian cancer Other 74       ovarian/uterus, Aunt,     Allergies: Patient has no known allergies. Current Outpatient Medications on File Prior to Visit  Medication Sig Dispense Refill  . amLODipine (NORVASC) 2.5 MG tablet Take 1 tablet (2.5 mg total) by mouth daily. (Patient taking differently: Take 2.5 mg by mouth daily. ) 90 tablet 3  . atorvastatin (LIPITOR) 20 MG tablet TAKE 1 TABLET (20 MG TOTAL) BY MOUTH DAILY. 90 tablet 2  . Cholecalciferol (VITAMIN D3) 1000 units CAPS Take by  mouth daily.    Marland Kitchen conjugated estrogens (PREMARIN) vaginal cream 0.5 g intravaginally once per week.. 30 g 2  . levocetirizine (XYZAL) 5 MG tablet Take 5 mg by mouth every evening.    . metoprolol succinate (TOPROL-XL) 50 MG 24 hr tablet TAKE 1 TABLET BY MOUTH EVERY DAY (MUST KEEP 03/05/14 APPT) 90 tablet 1  . mometasone (NASONEX) 50 MCG/ACT nasal spray Place 2 sprays into the nose daily. 17 g 12  . Multiple Vitamins-Minerals (WOMENS MULTIVITAMIN) TABS Take by mouth daily.     No current facility-administered medications on file prior to visit.     Social History   Tobacco Use  . Smoking status: Never Smoker  . Smokeless tobacco: Never Used  Substance Use Topics  . Alcohol use: Not on file  . Drug use: Not on file    Review of Systems  Constitutional: Negative for chills and fever.  Respiratory: Negative for cough and shortness of breath.   Cardiovascular: Positive for palpitations. Negative for chest pain.  Gastrointestinal: Negative for nausea and vomiting.  Psychiatric/Behavioral: The patient is nervous/anxious.       Objective:    BP 118/76 (BP Location: Left Arm, Patient Position: Sitting, Cuff Size: Normal)   Pulse 60   Temp 98.2 F (36.8 C) (Oral)  Resp 15   Wt 138 lb 2 oz (62.7 kg)   SpO2 100%   BMI 21.00 kg/m  BP Readings from Last 3 Encounters:  12/18/17 118/76  11/19/17 134/82  06/28/17 128/80   Wt Readings from Last 3 Encounters:  12/18/17 138 lb 2 oz (62.7 kg)  11/19/17 140 lb 8 oz (63.7 kg)  06/28/17 142 lb 4 oz (64.5 kg)    Physical Exam  Constitutional: She appears well-developed and well-nourished.  Eyes: Conjunctivae are normal.  Cardiovascular: Normal rate, regular rhythm, normal heart sounds and normal pulses.  Pulmonary/Chest: Effort normal and breath sounds normal. She has no wheezes. She has no rhonchi. She has no rales.  Neurological: She is alert.  Skin: Skin is warm and dry.  Psychiatric: She has a normal mood and affect. Her speech  is normal and behavior is normal. Thought content normal.  Vitals reviewed.      Assessment & Plan:   Problem List Items Addressed This Visit      Cardiovascular and Mediastinum   Hypertension - Primary    At goal. Will continue current regimen.         Other   Palpitation    Intermittent.  Improved.  Patient and I  discussed at length anxiety may be contributing due to her recent retirement and figuring out new routine.  No caffeine use, normal TSH, CBC couple months ago.  EKG today is reassuring.  Normal sinus rhythm, no ischemia or significant changes compared to prior EKG 2014.  Advised patient let me know if recurs and certainly if persists.  Patient will stay vigilant      Relevant Orders   EKG 12-Lead (Completed)       I have discontinued Birdie HopesSusan S. Broda's cetirizine. I am also having her maintain her conjugated estrogens, amLODipine, atorvastatin, mometasone, metoprolol succinate, WOMENS MULTIVITAMIN, Vitamin D3, and levocetirizine.   No orders of the defined types were placed in this encounter.   Return precautions given.   Risks, benefits, and alternatives of the medications and treatment plan prescribed today were discussed, and patient expressed understanding.   Education regarding symptom management and diagnosis given to patient on AVS.  Continue to follow with Allegra GranaArnett, Dorance Spink G, FNP for routine health maintenance.   Trixie RudeSusan S Notaro and I agreed with plan.   Rennie PlowmanMargaret Avrielle Fry, FNP

## 2018-01-06 ENCOUNTER — Other Ambulatory Visit: Payer: Self-pay | Admitting: Family

## 2018-01-06 DIAGNOSIS — I10 Essential (primary) hypertension: Secondary | ICD-10-CM

## 2018-01-06 NOTE — Telephone Encounter (Signed)
Look like she is taking 5 mg ?

## 2018-01-08 ENCOUNTER — Encounter: Payer: Self-pay | Admitting: Family

## 2018-01-08 MED ORDER — AMLODIPINE BESYLATE 5 MG PO TABS: 5.0000 mg | ORAL_TABLET | Freq: Every day | ORAL | 3 refills | Status: DC

## 2018-01-16 ENCOUNTER — Other Ambulatory Visit: Payer: Self-pay

## 2018-01-16 ENCOUNTER — Encounter: Payer: Self-pay | Admitting: Family

## 2018-01-16 MED ORDER — ATORVASTATIN CALCIUM 20 MG PO TABS: 20.0000 mg | ORAL_TABLET | Freq: Every day | ORAL | 2 refills | Status: DC

## 2018-02-21 ENCOUNTER — Other Ambulatory Visit: Payer: Self-pay | Admitting: Family

## 2018-02-21 DIAGNOSIS — I10 Essential (primary) hypertension: Secondary | ICD-10-CM

## 2018-05-17 ENCOUNTER — Other Ambulatory Visit: Payer: Self-pay | Admitting: Family

## 2018-05-17 DIAGNOSIS — I10 Essential (primary) hypertension: Secondary | ICD-10-CM

## 2018-06-30 ENCOUNTER — Other Ambulatory Visit: Payer: Self-pay | Admitting: Family

## 2018-07-02 ENCOUNTER — Encounter: Payer: Self-pay | Admitting: Family

## 2018-07-02 ENCOUNTER — Ambulatory Visit: Payer: BC Managed Care – PPO | Admitting: Family

## 2018-07-02 ENCOUNTER — Other Ambulatory Visit: Payer: Self-pay | Admitting: Family

## 2018-07-02 ENCOUNTER — Other Ambulatory Visit: Payer: Self-pay

## 2018-07-02 VITALS — BP 102/72 | HR 61 | Temp 98.0°F | Ht 67.75 in | Wt 140.4 lb

## 2018-07-02 DIAGNOSIS — I10 Essential (primary) hypertension: Secondary | ICD-10-CM

## 2018-07-02 DIAGNOSIS — R7989 Other specified abnormal findings of blood chemistry: Secondary | ICD-10-CM

## 2018-07-02 DIAGNOSIS — Z0000 Encounter for general adult medical examination without abnormal findings: Secondary | ICD-10-CM

## 2018-07-02 DIAGNOSIS — K219 Gastro-esophageal reflux disease without esophagitis: Secondary | ICD-10-CM

## 2018-07-02 DIAGNOSIS — J309 Allergic rhinitis, unspecified: Secondary | ICD-10-CM

## 2018-07-02 DIAGNOSIS — R945 Abnormal results of liver function studies: Secondary | ICD-10-CM

## 2018-07-02 LAB — LIPID PANEL
CHOLESTEROL: 189 mg/dL (ref 0–200)
HDL: 63.9 mg/dL (ref >39.00)
LDL Cholesterol: 96 mg/dL (ref 0–99)
NONHDL: 125.38
Total CHOL/HDL Ratio: 3
Triglycerides: 148 mg/dL (ref 0.0–149.0)
VLDL: 29.6 mg/dL (ref 0.0–40.0)

## 2018-07-02 LAB — COMPREHENSIVE METABOLIC PANEL
ALBUMIN: 4.7 g/dL (ref 3.5–5.2)
ALK PHOS: 60 U/L (ref 39–117)
ALT: 37 U/L — AB (ref 0–35)
AST: 32 U/L (ref 0–37)
BILIRUBIN TOTAL: 1.2 mg/dL (ref 0.2–1.2)
BUN: 18 mg/dL (ref 6–23)
CO2: 26 mEq/L (ref 19–32)
Calcium: 9.5 mg/dL (ref 8.4–10.5)
Chloride: 103 mEq/L (ref 96–112)
Creatinine, Ser: 0.81 mg/dL (ref 0.40–1.20)
GFR: 71.99 mL/min (ref >60.00)
GLUCOSE: 79 mg/dL (ref 70–99)
Potassium: 4.4 mEq/L (ref 3.5–5.1)
SODIUM: 137 meq/L (ref 135–145)
TOTAL PROTEIN: 7.5 g/dL (ref 6.0–8.3)

## 2018-07-02 LAB — VITAMIN D 25 HYDROXY (VIT D DEFICIENCY, FRACTURES): VITD: 36.27 ng/mL (ref 30.00–100.00)

## 2018-07-02 NOTE — Progress Notes (Signed)
Subjective:    Patient ID: Natasha Pace, female    DOB: [birth year], 63 y.o.   MRN: [id number]  CC: Natasha Pace is a 63 y.o. female who presents today for physical exam.    HPI: Feeling well, no new complaints .  HTN-no longer on amlodipine. On metoprolol. Feels better , more energy since off amlodipine. At home BP, 134/70. Thinks that BP cuff at home runs higher than actual BP , as this was noted in the office in the past  Denies exertional chest pain or pressure, numbness or tingling radiating to left arm or jaw, palpitations, dizziness, frequent headaches, changes in vision, or shortness of breath.    Chronic Left sinus pressure and pain- has seen ENT Fall 2019. 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 On flonase, Zyzal. Considering sinus surgery .   GERD- taking zantac BID. Endorses burning in throat, burping.  Nexium worked better. No trouble swallowing, pain with swallowing.    Colorectal Cancer Screening: UTD  Breast Cancer Screening: Mammogram UTD Cervical Cancer Screening: UTD Bone Health screening/DEXA for 65+: No increased fracture risk. Defer screening at this time. Lung Cancer Screening: Doesn't have 30 year pack year history and age > 55 years.       Tetanus - utd        Labs: Screening labs today. Exercise: Gets regular exercise, walking.  Alcohol use: occasional Smoking/tobacco use: Nonsmoker.  Wears seat belt: Yes. Skin: no new lesions. Has seen Dr Adolphus Birchwood in the past.  Will call his office if any concerns  HISTORY:  Past Medical History:  Diagnosis Date  . Allergy    hay fever  . Chicken pox   . GERD (gastroesophageal reflux disease)   . Hyperlipidemia   . Hypertension   . Vaginal polyp 2014   benign    Past Surgical History:  Procedure Laterality Date  . INTRAUTERINE DEVICE INSERTION     Removed 2014, Mirena, [facility], [place]  . VAGINAL DELIVERY     2   Family History  Problem Relation Age of Onset  . Hypertension Mother   .  Diabetes Maternal Grandmother   . Cancer Maternal Grandmother        breast  . Breast cancer Maternal Grandmother 83  . Hypertension Brother   . Hypertension Father   . Diabetes Maternal Grandfather   . Ovarian cancer Other 74       ovarian/uterus, Aunt,       ALLERGIES: Patient has no known allergies.  Current Outpatient Medications on File Prior to Visit  Medication Sig Dispense Refill  . atorvastatin (LIPITOR) 20 MG tablet TAKE 1 TABLET BY MOUTH EVERY DAY 90 tablet 2  . Cholecalciferol (VITAMIN D3) 1000 units CAPS Take by mouth daily.    Marland Kitchen conjugated estrogens (PREMARIN) vaginal cream 0.5 g intravaginally once per week.. 30 g 2  . fluticasone (FLONASE) 50 MCG/ACT nasal spray Place 1 spray into both nostrils daily.    Marland Kitchen levocetirizine (XYZAL) 5 MG tablet Take 5 mg by mouth every evening.    . metoprolol succinate (TOPROL-XL) 50 MG 24 hr tablet TAKE 1 TABLET BY MOUTH EVERY DAY (MUST KEEP 03/05/14 APPT) 90 tablet 0  . mometasone (NASONEX) 50 MCG/ACT nasal spray Place 2 sprays into the nose daily. 17 g 12  . Multiple Vitamins-Minerals (WOMENS MULTIVITAMIN) TABS Take by mouth daily.     No current facility-administered medications on file prior to visit.     Social History   Tobacco  Use  . Smoking status: Never Smoker  . Smokeless tobacco: Never Used  Substance Use Topics  . Alcohol use: Yes    Comment: 3-4 days per week, one glass of wine.   . Drug use: Not on file    Review of Systems  Constitutional: Negative for chills, fever and unexpected weight change.  HENT: Positive for sinus pain (left, chronic). Negative for congestion and trouble swallowing.   Respiratory: Negative for cough.   Cardiovascular: Negative for chest pain, palpitations and leg swelling.  Gastrointestinal: Negative for nausea and vomiting.  Genitourinary: Negative for pelvic pain.  Musculoskeletal: Negative for arthralgias and myalgias.  Skin: Negative for rash.  Neurological: Negative for  headaches.  Hematological: Negative for adenopathy.  Psychiatric/Behavioral: Negative for confusion.      Objective:    BP 102/72 (BP Location: Left Arm, Patient Position: Sitting, Cuff Size: Normal)   Pulse 61   Temp 98 F (36.7 C)   Ht 5' 7.75" (1.721 m)   Wt 140 lb 6.4 oz (63.7 kg)   SpO2 99%   BMI 21.51 kg/m   BP Readings from Last 3 Encounters:  07/02/18 102/72  12/18/17 118/76  11/19/17 134/82   Wt Readings from Last 3 Encounters:  07/02/18 140 lb 6.4 oz (63.7 kg)  12/18/17 138 lb 2 oz (62.7 kg)  11/19/17 140 lb 8 oz (63.7 kg)    Physical Exam Vitals signs reviewed.  Constitutional:      Appearance: She is well-developed.  Eyes:     Conjunctiva/sclera: Conjunctivae normal.  Neck:     Thyroid: No thyroid mass or thyromegaly.  Cardiovascular:     Rate and Rhythm: Normal rate and regular rhythm.     Pulses: Normal pulses.     Heart sounds: Normal heart sounds.  Pulmonary:     Effort: Pulmonary effort is normal.     Breath sounds: Normal breath sounds. No wheezing, rhonchi or rales.  Chest:     Breasts: Breasts are symmetrical.        Right: No inverted nipple, mass, nipple discharge, skin change or tenderness.        Left: No inverted nipple, mass, nipple discharge, skin change or tenderness.  Lymphadenopathy:     Head:     Right side of head: No submental, submandibular, tonsillar, preauricular, posterior auricular or occipital adenopathy.     Left side of head: No submental, submandibular, tonsillar, preauricular, posterior auricular or occipital adenopathy.     Cervical: No cervical adenopathy.     Right cervical: No superficial, deep or posterior cervical adenopathy.    Left cervical: No superficial, deep or posterior cervical adenopathy.  Skin:    General: Skin is warm and dry.  Neurological:     Mental Status: She is alert.  Psychiatric:        Speech: Speech normal.        Behavior: Behavior normal.        Thought Content: Thought content normal.         Assessment & Plan:   Problem List Items Addressed This Visit      Cardiovascular and Mediastinum   Hypertension    Overall pleased with blood pressure.  Patient to continue to monitor as I do want ensure that her blood pressure is y consistently less than 120/80.  She will let me know.  For now, she will stay off amlodipine.        Respiratory   Allergic rhinitis    Chronic, following with  [employer].  Considering sinus surgery.  Will follow        Digestive   GERD (gastroesophageal reflux disease)    No alarm symptoms today.  Advised patient she may use Nexium from time to time.  Education provided on after visit summary.  Discussed importance of having an EGD with colonoscopy in the next 2 years, sooner of course if symptoms present.        Other   Routine general medical examination at a health care facility - Primary    Clinical breast exam performed, deferred pelvic exam in the absence of complaints, Pap smear is up-to-date.      Relevant Orders   Comprehensive metabolic panel   Lipid panel   VITAMIN D 25 Hydroxy (Vit-D Deficiency, Fractures)       I have discontinued Anner Phaup. Trigg's amLODipine. I am also having her maintain her conjugated estrogens, mometasone, Womens Multivitamin, Vitamin D3, levocetirizine, metoprolol succinate, atorvastatin, and fluticasone.   No orders of the defined types were placed in this encounter.   Return precautions given.   Risks, benefits, and alternatives of the medications and treatment plan prescribed today were discussed, and patient expressed understanding.   Education regarding symptom management and diagnosis given to patient on AVS.   Continue to follow with Allegra Grana, FNP for routine health maintenance.   Trixie Rude and I agreed with plan.   Rennie Plowman, FNP

## 2018-07-02 NOTE — Assessment & Plan Note (Signed)
Chronic, following with [facility].  Considering sinus surgery.  Will follow

## 2018-07-02 NOTE — Assessment & Plan Note (Signed)
Overall pleased with blood pressure.  Patient to continue to monitor as I do want ensure that her blood pressure is y consistently less than 120/80.  She will let me know.  For now, she will stay off amlodipine.

## 2018-07-02 NOTE — Patient Instructions (Addendum)
Monitor blood pressure,  Goal is less than 120/80, based on newest guidelines; if persistently higher, please make sooner follow up appointment so we can recheck you blood pressure and manage medications  Long term use beyond 3 months of proton pump inhibitors , also called PPI's, is associated with malabsorption of vitamins, chronic kidney disease, fracture risk, and diarrheal illnesses. PPI's include Nexium, Prilosec, Protonix, Dexilant, and Prevacid.   I generally recommend trying to control acid reflux with lifestyle modifications including avoiding trigger foods, not eating 2 hours prior to bedtime. You may use histamine 2 blockers daily to twice daily ( this is Zantac, Pepcid) and then when symptoms flare, start back on PPI for short course.   Of note, we will need to do an endoscopy ( upper GI) to evaluate your esophagus, stomach in the future if acid reflux persists are you develop red flag symptoms: trouble swallowing, hoarseness, chronic cough, unexplained weight loss.   Health Maintenance for Postmenopausal Women Menopause is a normal process in which your reproductive ability comes to an end. This process happens gradually over a span of months to years, usually between the ages of 28 and 80. Menopause is complete when you have missed 12 consecutive menstrual periods. It is important to talk with your health care provider about some of the most common conditions that affect postmenopausal women, such as heart disease, cancer, and bone loss (osteoporosis). Adopting a healthy lifestyle and getting preventive care can help to promote your health and wellness. Those actions can also lower your chances of developing some of these common conditions. What should I know about menopause? During menopause, you may experience a number of symptoms, such as:  Moderate-to-severe hot flashes.  Night sweats.  Decrease in sex drive.  Mood swings.  Headaches.  Tiredness.  Irritability.  Memory  problems.  Insomnia. Choosing to treat or not to treat menopausal changes is an individual decision that you make with your health care provider. What should I know about hormone replacement therapy and supplements? Hormone therapy products are effective for treating symptoms that are associated with menopause, such as hot flashes and night sweats. Hormone replacement carries certain risks, especially as you become older. If you are thinking about using estrogen or estrogen with progestin treatments, discuss the benefits and risks with your health care provider. What should I know about heart disease and stroke? Heart disease, heart attack, and stroke become more likely as you age. This may be due, in part, to the hormonal changes that your body experiences during menopause. These can affect how your body processes dietary fats, triglycerides, and cholesterol. Heart attack and stroke are both medical emergencies. There are many things that you can do to help prevent heart disease and stroke:  Have your blood pressure checked at least every 1-2 years. High blood pressure causes heart disease and increases the risk of stroke.  If you are 45-57 years old, ask your health care provider if you should take aspirin to prevent a heart attack or a stroke.  Do not use any tobacco products, including cigarettes, chewing tobacco, or electronic cigarettes. If you need help quitting, ask your health care provider.  It is important to eat a healthy diet and maintain a healthy weight. ? Be sure to include plenty of vegetables, fruits, low-fat dairy products, and lean protein. ? Avoid eating foods that are high in solid fats, added sugars, or salt (sodium).  Get regular exercise. This is one of the most important things that you  can do for your health. ? Try to exercise for at least 150 minutes each week. The type of exercise that you do should increase your heart rate and make you sweat. This is known as  moderate-intensity exercise. ? Try to do strengthening exercises at least twice each week. Do these in addition to the moderate-intensity exercise.  Know your numbers.Ask your health care provider to check your cholesterol and your blood glucose. Continue to have your blood tested as directed by your health care provider.  What should I know about cancer screening? There are several types of cancer. Take the following steps to reduce your risk and to catch any cancer development as early as possible. Breast Cancer  Practice breast self-awareness. ? This means understanding how your breasts normally appear and feel. ? It also means doing regular breast self-exams. Let your health care provider know about any changes, no matter how small.  If you are 71 or older, have a clinician do a breast exam (clinical breast exam or CBE) every year. Depending on your age, family history, and medical history, it may be recommended that you also have a yearly breast X-ray (mammogram).  If you have a family history of breast cancer, talk with your health care provider about genetic screening.  If you are at high risk for breast cancer, talk with your health care provider about having an MRI and a mammogram every year.  Breast cancer (BRCA) gene test is recommended for women who have family members with BRCA-related cancers. Results of the assessment will determine the need for genetic counseling and BRCA1 and for BRCA2 testing. BRCA-related cancers include these types: ? Breast. This occurs in males or females. ? Ovarian. ? Tubal. This may also be called fallopian tube cancer. ? Cancer of the abdominal or pelvic lining (peritoneal cancer). ? Prostate. ? Pancreatic. Cervical, Uterine, and Ovarian Cancer Your health care provider may recommend that you be screened regularly for cancer of the pelvic organs. These include your ovaries, uterus, and vagina. This screening involves a pelvic exam, which includes  checking for microscopic changes to the surface of your cervix (Pap test).  For women ages 21-65, health care providers may recommend a pelvic exam and a Pap test every three years. For women ages 29-65, they may recommend the Pap test and pelvic exam, combined with testing for human papilloma virus (HPV), every five years. Some types of HPV increase your risk of cervical cancer. Testing for HPV may also be done on women of any age who have unclear Pap test results.  Other health care providers may not recommend any screening for nonpregnant women who are considered low risk for pelvic cancer and have no symptoms. Ask your health care provider if a screening pelvic exam is right for you.  If you have had past treatment for cervical cancer or a condition that could lead to cancer, you need Pap tests and screening for cancer for at least 20 years after your treatment. If Pap tests have been discontinued for you, your risk factors (such as having a new sexual partner) need to be reassessed to determine if you should start having screenings again. Some women have medical problems that increase the chance of getting cervical cancer. In these cases, your health care provider may recommend that you have screening and Pap tests more often.  If you have a family history of uterine cancer or ovarian cancer, talk with your health care provider about genetic screening.  If you have  vaginal bleeding after reaching menopause, tell your health care provider.  There are currently no reliable tests available to screen for ovarian cancer. Lung Cancer Lung cancer screening is recommended for adults 56-57 years old who are at high risk for lung cancer because of a history of smoking. A yearly low-dose CT scan of the lungs is recommended if you:  Currently smoke.  Have a history of at least 30 pack-years of smoking and you currently smoke or have quit within the past 15 years. A pack-year is smoking an average of one  pack of cigarettes per day for one year. Yearly screening should:  Continue until it has been 15 years since you quit.  Stop if you develop a health problem that would prevent you from having lung cancer treatment. Colorectal Cancer  This type of cancer can be detected and can often be prevented.  Routine colorectal cancer screening usually begins at age 59 and continues through age 33.  If you have risk factors for colon cancer, your health care provider may recommend that you be screened at an earlier age.  If you have a family history of colorectal cancer, talk with your health care provider about genetic screening.  Your health care provider may also recommend using home test kits to check for hidden blood in your stool.  A small camera at the end of a tube can be used to examine your colon directly (sigmoidoscopy or colonoscopy). This is done to check for the earliest forms of colorectal cancer.  Direct examination of the colon should be repeated every 5-10 years until age 28. However, if early forms of precancerous polyps or small growths are found or if you have a family history or genetic risk for colorectal cancer, you may need to be screened more often. Skin Cancer  Check your skin from head to toe regularly.  Monitor any moles. Be sure to tell your health care provider: ? About any new moles or changes in moles, especially if there is a change in a mole's shape or color. ? If you have a mole that is larger than the size of a pencil eraser.  If any of your family members has a history of skin cancer, especially at a young age, talk with your health care provider about genetic screening.  Always use sunscreen. Apply sunscreen liberally and repeatedly throughout the day.  Whenever you are outside, protect yourself by wearing long sleeves, pants, a wide-brimmed hat, and sunglasses. What should I know about osteoporosis? Osteoporosis is a condition in which bone destruction  happens more quickly than new bone creation. After menopause, you may be at an increased risk for osteoporosis. To help prevent osteoporosis or the bone fractures that can happen because of osteoporosis, the following is recommended:  If you are 72-57 years old, get at least 1,000 mg of calcium and at least 600 mg of vitamin D per day.  If you are older than age 30 but younger than age 50, get at least 1,200 mg of calcium and at least 600 mg of vitamin D per day.  If you are older than age 43, get at least 1,200 mg of calcium and at least 800 mg of vitamin D per day. Smoking and excessive alcohol intake increase the risk of osteoporosis. Eat foods that are rich in calcium and vitamin D, and do weight-bearing exercises several times each week as directed by your health care provider. What should I know about how menopause affects my mental health?  Depression may occur at any age, but it is more common as you become older. Common symptoms of depression include:  Low or sad mood.  Changes in sleep patterns.  Changes in appetite or eating patterns.  Feeling an overall lack of motivation or enjoyment of activities that you previously enjoyed.  Frequent crying spells. Talk with your health care provider if you think that you are experiencing depression. What should I know about immunizations? It is important that you get and maintain your immunizations. These include:  Tetanus, diphtheria, and pertussis (Tdap) booster vaccine.  Influenza every year before the flu season begins.  Pneumonia vaccine.  Shingles vaccine. Your health care provider may also recommend other immunizations. This information is not intended to replace advice given to you by your health care provider. Make sure you discuss any questions you have with your health care provider. Document Released: 06/01/2005 Document Revised: 10/28/2015 Document Reviewed: 01/11/2015 Elsevier Interactive Patient Education  2019  [employer].

## 2018-07-02 NOTE — Assessment & Plan Note (Signed)
Clinical breast exam performed, deferred pelvic exam in the absence of complaints, Pap smear is up-to-date.

## 2018-07-02 NOTE — Assessment & Plan Note (Addendum)
No alarm symptoms today.  Advised patient she may use Nexium from time to time.  Education provided on after visit summary.  Discussed importance of having an EGD with colonoscopy in the next 2 years, sooner of course if symptoms present.

## 2018-07-09 ENCOUNTER — Other Ambulatory Visit: Payer: Self-pay

## 2018-07-09 ENCOUNTER — Other Ambulatory Visit (INDEPENDENT_AMBULATORY_CARE_PROVIDER_SITE_OTHER): Payer: BC Managed Care – PPO

## 2018-07-09 DIAGNOSIS — R7989 Other specified abnormal findings of blood chemistry: Secondary | ICD-10-CM

## 2018-07-09 DIAGNOSIS — R945 Abnormal results of liver function studies: Secondary | ICD-10-CM | POA: Diagnosis not present

## 2018-07-09 LAB — COMPREHENSIVE METABOLIC PANEL
ALT: 31 U/L (ref 0–35)
AST: 25 U/L (ref 0–37)
Albumin: 4.6 g/dL (ref 3.5–5.2)
Alkaline Phosphatase: 62 U/L (ref 39–117)
BUN: 20 mg/dL (ref 6–23)
CALCIUM: 9.6 mg/dL (ref 8.4–10.5)
CO2: 29 meq/L (ref 19–32)
Chloride: 103 mEq/L (ref 96–112)
Creatinine, Ser: 0.78 mg/dL (ref 0.40–1.20)
GFR: 75.19 mL/min (ref >60.00)
Glucose, Bld: 78 mg/dL (ref 70–99)
POTASSIUM: 4.4 meq/L (ref 3.5–5.1)
Sodium: 138 mEq/L (ref 135–145)
Total Bilirubin: 1.1 mg/dL (ref 0.2–1.2)
Total Protein: 7.5 g/dL (ref 6.0–8.3)

## 2018-08-18 ENCOUNTER — Other Ambulatory Visit: Payer: Self-pay | Admitting: Family

## 2018-08-18 DIAGNOSIS — I10 Essential (primary) hypertension: Secondary | ICD-10-CM

## 2018-09-05 ENCOUNTER — Other Ambulatory Visit: Payer: Self-pay | Admitting: Family

## 2018-09-05 DIAGNOSIS — I10 Essential (primary) hypertension: Secondary | ICD-10-CM

## 2018-09-05 DIAGNOSIS — N952 Postmenopausal atrophic vaginitis: Secondary | ICD-10-CM

## 2018-10-30 ENCOUNTER — Encounter: Payer: Self-pay | Admitting: Family

## 2018-11-12 ENCOUNTER — Other Ambulatory Visit: Payer: Self-pay | Admitting: Family

## 2018-11-12 DIAGNOSIS — I10 Essential (primary) hypertension: Secondary | ICD-10-CM

## 2018-11-25 ENCOUNTER — Other Ambulatory Visit: Payer: Self-pay | Admitting: Family

## 2018-11-25 DIAGNOSIS — Z1231 Encounter for screening mammogram for malignant neoplasm of breast: Secondary | ICD-10-CM

## 2018-12-11 ENCOUNTER — Other Ambulatory Visit: Payer: Self-pay | Admitting: Family

## 2018-12-11 DIAGNOSIS — N952 Postmenopausal atrophic vaginitis: Secondary | ICD-10-CM

## 2018-12-11 DIAGNOSIS — I10 Essential (primary) hypertension: Secondary | ICD-10-CM

## 2019-01-07 ENCOUNTER — Encounter: Payer: Self-pay | Admitting: Family

## 2019-01-07 ENCOUNTER — Other Ambulatory Visit: Payer: Self-pay | Admitting: Family

## 2019-01-07 DIAGNOSIS — I10 Essential (primary) hypertension: Secondary | ICD-10-CM

## 2019-01-07 MED ORDER — AMLODIPINE BESYLATE 2.5 MG PO TABS: 2.5000 mg | ORAL_TABLET | Freq: Every day | ORAL | 3 refills | Status: DC

## 2019-01-08 ENCOUNTER — Ambulatory Visit
Admission: RE | Admit: 2019-01-08 | Discharge: 2019-01-08 | Disposition: A | Discharge status: Home / Self Care | Payer: BC Managed Care – PPO | Source: Ambulatory Visit | Attending: Family | Admitting: Family

## 2019-01-08 DIAGNOSIS — Z1231 Encounter for screening mammogram for malignant neoplasm of breast: Secondary | ICD-10-CM

## 2019-01-18 ENCOUNTER — Other Ambulatory Visit: Payer: Self-pay | Admitting: Family

## 2019-01-18 DIAGNOSIS — I10 Essential (primary) hypertension: Secondary | ICD-10-CM

## 2019-01-19 ENCOUNTER — Other Ambulatory Visit: Payer: Self-pay | Admitting: Family

## 2019-01-19 DIAGNOSIS — J309 Allergic rhinitis, unspecified: Secondary | ICD-10-CM

## 2019-04-09 ENCOUNTER — Other Ambulatory Visit: Payer: Self-pay | Admitting: Family

## 2019-04-09 DIAGNOSIS — J309 Allergic rhinitis, unspecified: Secondary | ICD-10-CM

## 2019-04-14 ENCOUNTER — Other Ambulatory Visit: Payer: Self-pay | Admitting: Family

## 2019-04-14 DIAGNOSIS — I10 Essential (primary) hypertension: Secondary | ICD-10-CM

## 2019-05-06 ENCOUNTER — Other Ambulatory Visit: Payer: Self-pay | Admitting: Family

## 2019-05-06 DIAGNOSIS — J309 Allergic rhinitis, unspecified: Secondary | ICD-10-CM

## 2019-05-18 ENCOUNTER — Telehealth: Payer: Self-pay | Admitting: Family

## 2019-05-20 ENCOUNTER — Other Ambulatory Visit: Payer: Self-pay | Admitting: Family

## 2019-05-20 DIAGNOSIS — J309 Allergic rhinitis, unspecified: Secondary | ICD-10-CM

## 2019-06-28 ENCOUNTER — Ambulatory Visit: Payer: BC Managed Care – PPO

## 2019-07-05 ENCOUNTER — Other Ambulatory Visit: Payer: Self-pay | Admitting: Family

## 2019-07-05 DIAGNOSIS — I10 Essential (primary) hypertension: Secondary | ICD-10-CM

## 2019-07-22 ENCOUNTER — Other Ambulatory Visit: Payer: Self-pay

## 2019-07-22 ENCOUNTER — Encounter: Payer: Self-pay | Admitting: Family

## 2019-07-22 ENCOUNTER — Ambulatory Visit (INDEPENDENT_AMBULATORY_CARE_PROVIDER_SITE_OTHER): Payer: BC Managed Care – PPO | Admitting: Family

## 2019-07-22 VITALS — BP 114/78 | HR 72 | Temp 96.9°F | Ht 68.5 in | Wt 142.4 lb

## 2019-07-22 DIAGNOSIS — I10 Essential (primary) hypertension: Secondary | ICD-10-CM | POA: Diagnosis not present

## 2019-07-22 DIAGNOSIS — N952 Postmenopausal atrophic vaginitis: Secondary | ICD-10-CM | POA: Diagnosis not present

## 2019-07-22 DIAGNOSIS — Z0000 Encounter for general adult medical examination without abnormal findings: Secondary | ICD-10-CM

## 2019-07-22 LAB — COMPREHENSIVE METABOLIC PANEL
ALT: 21 U/L (ref 0–35)
AST: 21 U/L (ref 0–37)
Albumin: 4.4 g/dL (ref 3.5–5.2)
Alkaline Phosphatase: 54 U/L (ref 39–117)
BUN: 18 mg/dL (ref 6–23)
CO2: 30 mEq/L (ref 19–32)
Calcium: 9.4 mg/dL (ref 8.4–10.5)
Chloride: 103 mEq/L (ref 96–112)
Creatinine, Ser: 0.73 mg/dL (ref 0.40–1.20)
GFR: 80.89 mL/min (ref >60.00)
Glucose, Bld: 77 mg/dL (ref 70–99)
Potassium: 4.3 mEq/L (ref 3.5–5.1)
Sodium: 139 mEq/L (ref 135–145)
Total Bilirubin: 1.2 mg/dL (ref 0.2–1.2)
Total Protein: 7.3 g/dL (ref 6.0–8.3)

## 2019-07-22 LAB — LIPID PANEL
Cholesterol: 173 mg/dL (ref 0–200)
HDL: 56.2 mg/dL (ref >39.00)
LDL Cholesterol: 91 mg/dL (ref 0–99)
NonHDL: 116.61
Total CHOL/HDL Ratio: 3
Triglycerides: 130 mg/dL (ref 0.0–149.0)
VLDL: 26 mg/dL (ref 0.0–40.0)

## 2019-07-22 LAB — VITAMIN D 25 HYDROXY (VIT D DEFICIENCY, FRACTURES): VITD: 40.56 ng/mL (ref 30.00–100.00)

## 2019-07-22 NOTE — Assessment & Plan Note (Signed)
Stable on Premarin.  She is using the lowest amount  ( once per week) for effectiveness.

## 2019-07-22 NOTE — Assessment & Plan Note (Signed)
Clinical breast exam performed today.  Mammogram is up-to-date.  Deferred pelvic exam in the absence of complaints and also Pap smear is up-to-date. Note: We discussed relevant and past history of abnormal labs today at length, we decided to order appropriate labs for screening today based on this discussion. Patient was comfortable with this. Patient was advised if any symptoms were to change after today's visit, to notify me as we may always order additional labs.

## 2019-07-22 NOTE — Progress Notes (Signed)
Subjective:    Patient ID: Natasha Pace, female    DOB: 11/17/1955, 64 y.o.   MRN: [id number]  CC: ARNOLD DEPINTO is a 64 y.o. female who presents today for physical exam.    HPI: Feels well today, no complaints. Hypertension-compliant with amlodipine, metoprolol.  Currently not checking at home as she feels well.  Has not experienced any palpitations.  Denies chest pain, shortness of breath, left arm pain, headache Hyperlipidemia-compliant with Lipitor.  Tolerating well.  Using Premarin with relief approximately once a week.  No vaginal bleeding    Colorectal Cancer Screening: UTD; on 10 year repeat Breast Cancer Screening: Mammogram UTD Cervical Cancer Screening: UTD        Tetanus - utd         Labs: Screening labs today. Exercise: Gets regular exercise, walking daily.   Alcohol use: occasional Smoking/tobacco use: Nonsmoker.   HISTORY:  Past Medical History:  Diagnosis Date  . Allergy    hay fever  . Chicken pox   . GERD (gastroesophageal reflux disease)   . Hyperlipidemia   . Hypertension   . Vaginal polyp 2014   benign    Past Surgical History:  Procedure Laterality Date  . INTRAUTERINE DEVICE INSERTION     Removed 2014, Mirena, [facility], [place]  . VAGINAL DELIVERY     2   Family History  Problem Relation Age of Onset  . Hypertension Mother   . Diabetes Maternal Grandmother   . Cancer Maternal Grandmother        breast  . Breast cancer Maternal Grandmother 88  . Hypertension Brother   . Hypertension Father   . Diabetes Maternal Grandfather   . Ovarian cancer Other 74       ovarian/uterus, Aunt,       ALLERGIES: Patient has no known allergies.  Current Outpatient Medications on File Prior to Visit  Medication Sig Dispense Refill  . amLODipine (NORVASC) 2.5 MG tablet Take 1 tablet (2.5 mg total) by mouth daily. 90 tablet 3  . atorvastatin (LIPITOR) 20 MG tablet TAKE 1 TABLET BY MOUTH EVERY DAY 90 tablet 2  . Cholecalciferol  (VITAMIN D3) 1000 units CAPS Take by mouth daily.    Marland Kitchen conjugated estrogens (PREMARIN) vaginal cream INSERT 0.5 G INTRAVAGINALLY ONCE PER WEEK. 30 g 2  . fluticasone (FLONASE) 50 MCG/ACT nasal spray Place 1 spray into both nostrils daily.    Marland Kitchen levocetirizine (XYZAL) 5 MG tablet Take 5 mg by mouth every evening.    . metoprolol succinate (TOPROL-XL) 50 MG 24 hr tablet TAKE 1 TABLET BY MOUTH EVERY DAY (NEED APPT FOR MORE REFILLS) 90 tablet 0  . mometasone (NASONEX) 50 MCG/ACT nasal spray PLACE 2 SPRAYS INTO THE NOSE DAILY. 17 g 1  . Multiple Vitamins-Minerals (WOMENS MULTIVITAMIN) TABS Take by mouth daily.     No current facility-administered medications on file prior to visit.    Social History   Tobacco Use  . Smoking status: Never Smoker  . Smokeless tobacco: Never Used  Substance Use Topics  . Alcohol use: Yes    Comment: 3-4 days per week, one glass of wine.   . Drug use: Not on file    Review of Systems  Constitutional: Negative for chills, fever and unexpected weight change.  HENT: Negative for congestion.   Respiratory: Negative for cough.   Cardiovascular: Negative for chest pain, palpitations (resolved) and leg swelling.  Gastrointestinal: Negative for nausea and vomiting.  Genitourinary: Negative for difficulty  urinating and vaginal bleeding.  Musculoskeletal: Negative for arthralgias and myalgias.  Skin: Negative for rash.  Neurological: Negative for headaches.  Hematological: Negative for adenopathy.  Psychiatric/Behavioral: Negative for confusion.      Objective:    BP 114/78   Pulse 72   Temp (!) 96.9 F (36.1 C) (Temporal)   Ht 5' 8.5" (1.74 m)   Wt 142 lb 6.4 oz (64.6 kg)   SpO2 99%   BMI 21.34 kg/m   BP Readings from Last 3 Encounters:  07/22/19 114/78  07/02/18 102/72  12/18/17 118/76   Wt Readings from Last 3 Encounters:  07/22/19 142 lb 6.4 oz (64.6 kg)  07/02/18 140 lb 6.4 oz (63.7 kg)  12/18/17 138 lb 2 oz (62.7 kg)    Physical  Exam Vitals reviewed.  Constitutional:      Appearance: She is well-developed.  Eyes:     Conjunctiva/sclera: Conjunctivae normal.  Neck:     Thyroid: No thyroid mass or thyromegaly.  Cardiovascular:     Rate and Rhythm: Normal rate and regular rhythm.     Pulses: Normal pulses.     Heart sounds: Normal heart sounds.  Pulmonary:     Effort: Pulmonary effort is normal.     Breath sounds: Normal breath sounds. No wheezing, rhonchi or rales.  Chest:     Breasts: Breasts are symmetrical.        Right: No inverted nipple, mass, nipple discharge, skin change or tenderness.        Left: No inverted nipple, mass, nipple discharge, skin change or tenderness.  Lymphadenopathy:     Head:     Right side of head: No submental, submandibular, tonsillar, preauricular, posterior auricular or occipital adenopathy.     Left side of head: No submental, submandibular, tonsillar, preauricular, posterior auricular or occipital adenopathy.     Cervical: No cervical adenopathy.     Right cervical: No superficial, deep or posterior cervical adenopathy.    Left cervical: No superficial, deep or posterior cervical adenopathy.  Skin:    General: Skin is warm and dry.  Neurological:     Mental Status: She is alert.  Psychiatric:        Speech: Speech normal.        Behavior: Behavior normal.        Thought Content: Thought content normal.        Assessment & Plan:   Problem List Items Addressed This Visit      Cardiovascular and Mediastinum   Hypertension    Stable, continue amlodipine, lisinopril        Genitourinary   Atrophic vaginitis    Stable on Premarin.  She is using the lowest amount  ( once per week) for effectiveness.         Other   Routine general medical examination at a health care facility - Primary    Clinical breast exam performed today.  Mammogram is up-to-date.  Deferred pelvic exam in the absence of complaints and also Pap smear is up-to-date. Note: We discussed  relevant and past history of abnormal labs today at length, we decided to order appropriate labs for screening today based on this discussion. Patient was comfortable with this. Patient was advised if any symptoms were to change after today's visit, to notify me as we may always order additional labs.       Relevant Orders   Comprehensive metabolic panel   Lipid panel   VITAMIN D 25 Hydroxy (Vit-D Deficiency, Fractures)  I am having Lauralie Blacksher. Panameno maintain her Womens Multivitamin, Vitamin D3, levocetirizine, fluticasone, Premarin, amLODipine, atorvastatin, mometasone, and metoprolol succinate.   No orders of the defined types were placed in this encounter.   Return precautions given.   Risks, benefits, and alternatives of the medications and treatment plan prescribed today were discussed, and patient expressed understanding.   Education regarding symptom management and diagnosis given to patient on AVS.   Continue to follow with Allegra Grana, FNP for routine health maintenance.   Trixie Rude and I agreed with plan.   Rennie Plowman, FNP

## 2019-07-22 NOTE — Patient Instructions (Signed)
Always enjoy seeing you . Let me know if you need anything at all.    Health Maintenance for Postmenopausal Women Menopause is a normal process in which your ability to get pregnant comes to an end. This process happens slowly over many months or years, usually between the ages of 71 and 80. Menopause is complete when you have missed your menstrual periods for 12 months. It is important to talk with your health care provider about some of the most common conditions that affect women after menopause (postmenopausal women). These include heart disease, cancer, and bone loss (osteoporosis). Adopting a healthy lifestyle and getting preventive care can help to promote your health and wellness. The actions you take can also lower your chances of developing some of these common conditions. What should I know about menopause? During menopause, you may get a number of symptoms, such as:  Hot flashes. These can be moderate or severe.  Night sweats.  Decrease in sex drive.  Mood swings.  Headaches.  Tiredness.  Irritability.  Memory problems.  Insomnia. Choosing to treat or not to treat these symptoms is a decision that you make with your health care provider. Do I need hormone replacement therapy?  Hormone replacement therapy is effective in treating symptoms that are caused by menopause, such as hot flashes and night sweats.  Hormone replacement carries certain risks, especially as you become older. If you are thinking about using estrogen or estrogen with progestin, discuss the benefits and risks with your health care provider. What is my risk for heart disease and stroke? The risk of heart disease, heart attack, and stroke increases as you age. One of the causes may be a change in the body's hormones during menopause. This can affect how your body uses dietary fats, triglycerides, and cholesterol. Heart attack and stroke are medical emergencies. There are many things that you can do to  help prevent heart disease and stroke. Watch your blood pressure  High blood pressure causes heart disease and increases the risk of stroke. This is more likely to develop in people who have high blood pressure readings, are of African descent, or are overweight.  Have your blood pressure checked: ? Every 3-5 years if you are 40-69 years of age. ? Every year if you are 87 years old or older. Eat a healthy diet   Eat a diet that includes plenty of vegetables, fruits, low-fat dairy products, and lean protein.  Do not eat a lot of foods that are high in solid fats, added sugars, or sodium. Get regular exercise Get regular exercise. This is one of the most important things you can do for your health. Most adults should:  Try to exercise for at least 150 minutes each week. The exercise should increase your heart rate and make you sweat (moderate-intensity exercise).  Try to do strengthening exercises at least twice each week. Do these in addition to the moderate-intensity exercise.  Spend less time sitting. Even light physical activity can be beneficial. Other tips  Work with your health care provider to achieve or maintain a healthy weight.  Do not use any products that contain nicotine or tobacco, such as cigarettes, e-cigarettes, and chewing tobacco. If you need help quitting, ask your health care provider.  Know your numbers. Ask your health care provider to check your cholesterol and your blood sugar (glucose). Continue to have your blood tested as directed by your health care provider. Do I need screening for cancer? Depending on your health history  and family history, you may need to have cancer screening at different stages of your life. This may include screening for:  Breast cancer.  Cervical cancer.  Lung cancer.  Colorectal cancer. What is my risk for osteoporosis? After menopause, you may be at increased risk for osteoporosis. Osteoporosis is a condition in which bone  destruction happens more quickly than new bone creation. To help prevent osteoporosis or the bone fractures that can happen because of osteoporosis, you may take the following actions:  If you are 18-96 years old, get at least 1,000 mg of calcium and at least 600 mg of vitamin D per day.  If you are older than age 56 but younger than age 52, get at least 1,200 mg of calcium and at least 600 mg of vitamin D per day.  If you are older than age 49, get at least 1,200 mg of calcium and at least 800 mg of vitamin D per day. Smoking and drinking excessive alcohol increase the risk of osteoporosis. Eat foods that are rich in calcium and vitamin D, and do weight-bearing exercises several times each week as directed by your health care provider. How does menopause affect my mental health? Depression may occur at any age, but it is more common as you become older. Common symptoms of depression include:  Low or sad mood.  Changes in sleep patterns.  Changes in appetite or eating patterns.  Feeling an overall lack of motivation or enjoyment of activities that you previously enjoyed.  Frequent crying spells. Talk with your health care provider if you think that you are experiencing depression. General instructions See your health care provider for regular wellness exams and vaccines. This may include:  Scheduling regular health, dental, and eye exams.  Getting and maintaining your vaccines. These include: ? Influenza vaccine. Get this vaccine each year before the flu season begins. ? Pneumonia vaccine. ? Shingles vaccine. ? Tetanus, diphtheria, and pertussis (Tdap) booster vaccine. Your health care provider may also recommend other immunizations. Tell your health care provider if you have ever been abused or do not feel safe at home. Summary  Menopause is a normal process in which your ability to get pregnant comes to an end.  This condition causes hot flashes, night sweats, decreased  interest in sex, mood swings, headaches, or lack of sleep.  Treatment for this condition may include hormone replacement therapy.  Take actions to keep yourself healthy, including exercising regularly, eating a healthy diet, watching your weight, and checking your blood pressure and blood sugar levels.  Get screened for cancer and depression. Make sure that you are up to date with all your vaccines. This information is not intended to replace advice given to you by your health care provider. Make sure you discuss any questions you have with your health care provider. Document Revised: 04/02/2018 Document Reviewed: 04/02/2018 Elsevier Patient Education  2020 [employer].

## 2019-07-22 NOTE — Assessment & Plan Note (Signed)
Stable, continue amlodipine, lisinopril

## 2019-11-09 ENCOUNTER — Other Ambulatory Visit: Payer: Self-pay | Admitting: Family

## 2019-11-09 DIAGNOSIS — I10 Essential (primary) hypertension: Secondary | ICD-10-CM

## 2019-11-24 ENCOUNTER — Other Ambulatory Visit: Payer: Self-pay

## 2019-11-25 ENCOUNTER — Ambulatory Visit (INDEPENDENT_AMBULATORY_CARE_PROVIDER_SITE_OTHER): Payer: BC Managed Care – PPO | Admitting: Internal Medicine

## 2019-11-25 ENCOUNTER — Encounter: Payer: Self-pay | Admitting: Internal Medicine

## 2019-11-25 ENCOUNTER — Other Ambulatory Visit: Payer: Self-pay

## 2019-11-25 VITALS — BP 120/84 | HR 71 | Temp 98.1°F | Ht 68.5 in | Wt 140.8 lb

## 2019-11-25 DIAGNOSIS — B009 Herpesviral infection, unspecified: Secondary | ICD-10-CM | POA: Diagnosis not present

## 2019-11-25 DIAGNOSIS — B002 Herpesviral gingivostomatitis and pharyngotonsillitis: Secondary | ICD-10-CM | POA: Diagnosis not present

## 2019-11-25 DIAGNOSIS — T148XXA Other injury of unspecified body region, initial encounter: Secondary | ICD-10-CM

## 2019-11-25 MED ORDER — MUPIROCIN 2 % EX OINT: 1.0000 "application " | TOPICAL_OINTMENT | Freq: Two times a day (BID) | CUTANEOUS | 0 refills | Status: DC

## 2019-11-25 MED ORDER — VALACYCLOVIR HCL 1 G PO TABS: 1000.0000 mg | ORAL_TABLET | Freq: Two times a day (BID) | ORAL | 0 refills | Status: DC

## 2019-11-25 NOTE — Patient Instructions (Signed)
Cold Sore  A cold sore, also called a fever blister, is a small, fluid-filled sore that forms inside the mouth or on the lips, gums, nose, chin, or cheeks. Cold sores can spread to other parts of the body, such as the eyes or fingers. In some people who have other medical conditions, cold sores can spread to multiple other body sites, including the genitals. Cold sores can spread from person to person (are contagious) until the sores crust over completely. Most cold sores go away within 2 weeks. What are the causes? Cold sores are caused by an infection from a common type of herpes simplex virus (HSV-1). HSV-1 is closely related to the HSV-2virus, which is the virus that causes genital herpes, but these viruses are not the same. Once a person is infected with HSV-1, the virus remains permanently in the body. HSV-1 is spread from person to person through close contact, such as through kissing, touching the affected area, or sharing personal items such as lip balm, razors, a drinking glass, or eating utensils. What increases the risk? You are more likely to develop this condition if you:  Are tired, stressed, or sick.  Are menstruating.  Are pregnant.  Take certain medicines.  Are exposed to cold weather or too much sun. What are the signs or symptoms? Symptoms of a cold sore outbreak go through different stages. These are the stages of a cold sore:  Tingling, itching, or burning is felt 1-2 days before the outbreak.  Fluid-filled blisters appear on the lips, inside the mouth, on the nose, or on the cheeks.  The blisters start to ooze clear fluid.  The blisters dry up, and a yellow crust appears in their place.  The crust falls off. In some cases, other symptoms can develop during a cold sore outbreak. These can include:  Fever.  Sore throat.  Headache.  Muscle aches.  Swollen neck glands. How is this diagnosed? This condition is diagnosed based on your medical history and a  physical exam. Your health care provider may do a blood test or may swab some fluid from your sore and then examine the swab in the lab. How is this treated? There is no cure for cold sores or HSV-1. There is also no vaccine for HSV-1. Most cold sores go away on their own without treatment within 2 weeks. Medicines cannot make the infection go away, but your health care provider may prescribe medicines to:  Help relieve some of the pain associated with the sores.  Work to stop the virus from multiplying.  Shorten healing time. Medicines may be in the form of creams, gels, pills, or a shot. Follow these instructions at home: Medicines  Take or apply over-the-counter and prescription medicines only as told by your health care provider.  Use a cotton-tip swab to apply creams or gels to your sores.  Ask your health care provider if you can take lysine supplements. Research has found that lysine may help heal the cold sore faster and prevent outbreaks. Sore care   Do not touch the sores or pick the scabs.  Wash your hands often. Do not touch your eyes without washing your hands first.  Keep the sores clean and dry.  If directed, apply ice to the sores: ? Put ice in a plastic bag. ? Place a towel between your skin and the bag. ? Leave the ice on for 20 minutes, 2-3 times a day. Eating and drinking  Eat a soft, bland diet. Avoid eating   hot, cold, or salty foods.  Use a straw if it hurts to drink out of a glass.  Eat foods that are rich in lysine, such as meat, fish, and dairy products.  Avoid sugary foods, chocolates, nuts, and grains. These foods are rich in a nutrient called arginine, which can cause the virus to multiply. Lifestyle  Do not kiss, have oral sex, or share personal items until your sores heal.  Stress, poor sleep, and being out in the sun can trigger outbreaks. Make sure you: ? Do activities that help you relax, such as deep breathing exercises or  meditation. ? Get enough sleep. ? Apply sunscreen on your lips before you go out in the sun. Contact a health care provider if:  You have symptoms for more than 2 weeks.  You have pus coming from the sores.  You have redness that is spreading.  You have pain or irritation in your eye.  You get sores on your genitals.  Your sores do not heal within 2 weeks.  You have frequent cold sore outbreaks. Get help right away if you have:  A fever and your symptoms suddenly get worse.  A headache and confusion.  Fatigue or loss of appetite.  A stiff neck or sensitivity to light. Summary  A cold sore, also called a fever blister, is a small, fluid-filled sore that forms inside the mouth or on the lips, gums, nose, chin, or cheeks.  Most cold sores go away on their own without treatment within 2 weeks. Your health care provider may prescribe medicines to help relieve some of the pain, work to stop the virus from multiplying, and shorten healing time.  Wash your hands often. Do not touch your eyes without washing your hands first.  Do not kiss, have oral sex, or share personal items until your sores heal.  Contact a health care provider if your sores do not heal within 2 weeks. This information is not intended to replace advice given to you by your health care provider. Make sure you discuss any questions you have with your health care provider. Document Revised: 07/30/2018 Document Reviewed: 09/09/2017 Elsevier Patient Education  2020 [employer]   

## 2019-11-25 NOTE — Progress Notes (Signed)
Patient presenting with possible bug bites to the left sided chin. Onset of 12 days ago. Patient states It has slightly been itching and at one point seemed open. Area is raised, red, and seems to have 2-3 breaks in the skin in a cluster.   [place] lot. Area  Is not painful but tender to the touch. Patient was unable to get in and be seen by dermatology. According to Patient has not history of skin conditions.

## 2019-11-25 NOTE — Progress Notes (Addendum)
Chief Complaint  Patient presents with  . Facial Laceration    possible bug bite    F/u  1. Left chin lesion started as 1 bump 10-12 days ago while at the beach and bumps spread and increased in size and ulcerated. Husband gets hsv/cold sores and recently had one below his nose. Area was slightly itchy, tender and now ulcerated and area feels heavy. Not sure if was bitten. She tried neosporin on the area w/o relief  Review of Systems  Respiratory: Negative for shortness of breath.   Cardiovascular: Negative for chest pain.  Skin: Positive for rash.   Past Medical History:  Diagnosis Date  . Allergy    hay fever  . Chicken pox   . GERD (gastroesophageal reflux disease)   . Hyperlipidemia   . Hypertension   . Vaginal polyp 2014   benign   Past Surgical History:  Procedure Laterality Date  . INTRAUTERINE DEVICE INSERTION     Removed 2014, Mirena, [facility], Melody [place]and  . VAGINAL DELIVERY     2   Family History  Problem Relation Age of Onset  . Hypertension Mother   . Diabetes Maternal Grandmother   . Cancer Maternal Grandmother        breast  . Breast cancer Maternal Grandmother 60  . Hypertension Brother   . Hypertension Father   . Diabetes Maternal Grandfather   . Ovarian cancer Other 74       ovarian/uterus, Aunt,    Social History   Socioeconomic History  . Marital status: Married    Spouse name: Not on file  . Number of children: Not on file  . Years of education: Not on file  . Highest education level: Not on file  Occupational History  . Not on file  Tobacco Use  . Smoking status: Never Smoker  . Smokeless tobacco: Never Used  Substance and Sexual Activity  . Alcohol use: Yes    Comment: 3-4 days per week, one glass of wine.   . Drug use: Not on file  . Sexual activity: Not on file  Other Topics Concern  . Not on file  Social History Narrative   Lives in [place] with husband. 2 girls in college.      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 Difficulty of Paying Living Expenses:   Food Insecurity:   . Worried About Programme researcher, broadcasting/film/video in the Last Year:   . Barista in the Last Year:   Transportation Needs:   . Freight forwarder (Medical):   Marland Kitchen Lack of Transportation (Non-Medical):   Physical Activity:   . Days of Exercise per Week:   . Minutes of Exercise per Session:   Stress:   . Feeling of Stress :   Social Connections:   . Frequency of Communication with Friends and Family:   . Frequency of Social Gatherings with Friends and Family:   . Attends Religious Services:   . Active Member of Clubs or Organizations:   . Attends Banker Meetings:   Marland Kitchen Marital Status:   Intimate Partner Violence:   . Fear of Current or Ex-Partner:   . Emotionally Abused:   Marland Kitchen Physically Abused:   . Sexually Abused:    Current Meds  Medication Sig  . amLODipine (  NORVASC) 2.5 MG tablet Take 1 tablet (2.5 mg total) by mouth daily.  Marland Kitchen atorvastatin (LIPITOR) 20 MG tablet TAKE 1 TABLET BY MOUTH EVERY DAY  . Cholecalciferol (VITAMIN D3) 1000 units CAPS Take by mouth daily.  Marland Kitchen conjugated estrogens (PREMARIN) vaginal cream INSERT 0.5 G INTRAVAGINALLY ONCE PER WEEK.  . fluticasone (FLONASE) 50 MCG/ACT nasal spray Place 1 spray into both nostrils daily.  Marland Kitchen levocetirizine (XYZAL) 5 MG tablet Take 5 mg by mouth every evening.  . metoprolol succinate (TOPROL-XL) 50 MG 24 hr tablet TAKE 1 TABLET BY MOUTH EVERY DAY (NEED APPT FOR MORE REFILLS)  . mometasone (NASONEX) 50 MCG/ACT nasal spray PLACE 2 SPRAYS INTO THE NOSE DAILY.  . Multiple Vitamins-Minerals (WOMENS MULTIVITAMIN) TABS Take by mouth daily.   No Known Allergies No results found for this or any previous visit (from the past 2160 hour(s)). Objective  Body mass index is 21.1 kg/m. Wt Readings from Last 3 Encounters:  11/25/19  140 lb 12.8 oz (63.9 kg)  07/22/19 142 lb 6.4 oz (64.6 kg)  07/02/18 140 lb 6.4 oz (63.7 kg)   Temp Readings from Last 3 Encounters:  11/25/19 98.1 F (36.7 C) (Oral)  07/22/19 (!) 96.9 F (36.1 C) (Temporal)  07/02/18 98 F (36.7 C)   BP Readings from Last 3 Encounters:  11/25/19 120/84  07/22/19 114/78  07/02/18 102/72   Pulse Readings from Last 3 Encounters:  11/25/19 71  07/22/19 72  07/02/18 61    Physical Exam Vitals and nursing note reviewed.  Constitutional:      Appearance: Normal appearance. She is well-developed and well-groomed.  HENT:     Head: Normocephalic and atraumatic.   Cardiovascular:     Rate and Rhythm: Normal rate and regular rhythm.     Heart sounds: Normal heart sounds. No murmur heard.   Pulmonary:     Effort: Pulmonary effort is normal.     Breath sounds: Normal breath sounds.  Skin:    General: Skin is warm and dry.  Neurological:     General: No focal deficit present.     Mental Status: She is alert and oriented to person, place, and time. Mental status is at baseline.     Gait: Gait normal.  Psychiatric:        Attention and Perception: Attention and perception normal.        Mood and Affect: Mood and affect normal.        Speech: Speech normal.        Behavior: Behavior normal. Behavior is cooperative.        Thought Content: Thought content normal.        Cognition and Memory: Cognition and memory normal.        Judgment: Judgment normal.     Assessment  Plan  HSV infection - Plan: HSV(herpes smplx)abs-1+2(IgG+IgM)-bld, valACYclovir (VALTREX) 1000 MG tablet bid x 7-10 days no h/o HSV outbreak before but family gets HSV ie husband see HPI  Oral herpes simplex infection - Plan: valACYclovir (VALTREX) 1000 MG tablet  Open wound - Plan: mupirocin ointment (BACTROBAN) 2 %  Bid prn ? Superficial bacterial infection imposed on hsv  Provider: Dr. French Ana McLean-Scocuzza-Internal Medicine

## 2019-11-27 ENCOUNTER — Encounter: Payer: Self-pay | Admitting: Internal Medicine

## 2019-11-27 ENCOUNTER — Other Ambulatory Visit: Payer: Self-pay | Admitting: Internal Medicine

## 2019-11-27 DIAGNOSIS — T148XXA Other injury of unspecified body region, initial encounter: Secondary | ICD-10-CM

## 2019-11-27 LAB — HSV(HERPES SMPLX)ABS-I+II(IGG+IGM)-BLD
HSV 1 Glycoprotein G Ab, IgG: <0.91 index (ref 0.00–0.90)
HSV 2 IgG, Type Spec: <0.91 index (ref 0.00–0.90)
HSVI/II Comb IgM: <0.91 Ratio (ref 0.00–0.90)

## 2019-11-27 MED ORDER — DOXYCYCLINE HYCLATE 100 MG PO TABS: 100.0000 mg | ORAL_TABLET | Freq: Two times a day (BID) | ORAL | 0 refills | Status: DC

## 2019-12-02 ENCOUNTER — Other Ambulatory Visit: Payer: Self-pay | Admitting: Family

## 2019-12-25 ENCOUNTER — Other Ambulatory Visit: Payer: Self-pay | Admitting: Family

## 2019-12-25 DIAGNOSIS — I10 Essential (primary) hypertension: Secondary | ICD-10-CM

## 2019-12-30 ENCOUNTER — Other Ambulatory Visit: Payer: Self-pay

## 2020-01-26 ENCOUNTER — Other Ambulatory Visit: Payer: Self-pay | Admitting: Family

## 2020-01-26 DIAGNOSIS — N952 Postmenopausal atrophic vaginitis: Secondary | ICD-10-CM

## 2020-01-26 DIAGNOSIS — I10 Essential (primary) hypertension: Secondary | ICD-10-CM

## 2020-02-01 ENCOUNTER — Other Ambulatory Visit: Payer: Self-pay | Admitting: Family

## 2020-02-01 ENCOUNTER — Encounter: Payer: Self-pay | Admitting: Family

## 2020-02-01 DIAGNOSIS — Z1231 Encounter for screening mammogram for malignant neoplasm of breast: Secondary | ICD-10-CM

## 2020-02-01 DIAGNOSIS — N952 Postmenopausal atrophic vaginitis: Secondary | ICD-10-CM

## 2020-02-01 DIAGNOSIS — I10 Essential (primary) hypertension: Secondary | ICD-10-CM

## 2020-02-02 ENCOUNTER — Encounter: Payer: Self-pay | Admitting: Nurse Practitioner

## 2020-02-02 ENCOUNTER — Other Ambulatory Visit: Payer: Self-pay

## 2020-02-02 ENCOUNTER — Ambulatory Visit (INDEPENDENT_AMBULATORY_CARE_PROVIDER_SITE_OTHER): Payer: BC Managed Care – PPO | Admitting: Nurse Practitioner

## 2020-02-02 VITALS — BP 120/70 | HR 65 | Temp 97.7°F | Ht 68.5 in | Wt 139.0 lb

## 2020-02-02 DIAGNOSIS — M67431 Ganglion, right wrist: Secondary | ICD-10-CM | POA: Insufficient documentation

## 2020-02-02 DIAGNOSIS — Z23 Encounter for immunization: Secondary | ICD-10-CM

## 2020-02-02 NOTE — Patient Instructions (Addendum)
I will look into a referral for you to hand surgery to address your assessed on your right  wrist.  Flu shot given today

## 2020-02-02 NOTE — Progress Notes (Addendum)
Established Patient Office Visit  Subjective:  Patient ID: Natasha Pace, female    DOB: [birth year]  Age: 64 y.o. MRN: [id number]  CC:  Chief Complaint  Patient presents with  . Acute Visit    possible cyst on wrist    HPI Natasha Pace is a healthy 64 year old who presents for a cyst on her right wrist that has been present since April.  It does not seem to be growing larger.  It is not painful unless she hits it on something.  There is no involvement in the joint and she has normal range of motion without any pain or tenderness.  No numbness or tingling.  No arthritis, or history of a cysts or lipomas anywhere else in the body.  She would like to have it removed.    Past Medical History:  Diagnosis Date  . Allergy    hay fever  . Chicken pox   . GERD (gastroesophageal reflux disease)   . Hyperlipidemia   . Hypertension   . Vaginal polyp 2014   benign    Past Surgical History:  Procedure Laterality Date  . INTRAUTERINE DEVICE INSERTION     Removed 2014, Mirena, [facility], [place]  . VAGINAL DELIVERY     2    Family History  Problem Relation Age of Onset  . Hypertension Mother   . Diabetes Maternal Grandmother   . Cancer Maternal Grandmother        breast  . Breast cancer Maternal Grandmother 23  . Hypertension Brother   . Hypertension Father   . Diabetes Maternal Grandfather   . Ovarian cancer Other 74       ovarian/uterus, Aunt,     Social History   Socioeconomic History  . Marital status: Married    Spouse name: Not on file  . Number of children: Not on file  . Years of education: Not on file  . Highest education level: Not on file  Occupational History  . Not on file  Tobacco Use  . Smoking status: Never Smoker  . Smokeless tobacco: Never Used  Substance and Sexual Activity  . Alcohol use: Yes    Comment: 3-4 days per week, one glass of wine.   . Drug use: Not on file  . Sexual activity: Not on file  Other Topics Concern  . Not on  file  Social History Narrative   Lives in [place] with husband. 2 girls in college.      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 Difficulty of Paying Living Expenses: Not on file  Food Insecurity:   . Worried About Programme researcher, broadcasting/film/video in the Last Year: Not on file  . Ran Out of Food in the Last Year: Not on file  Transportation Needs:   . Lack of Transportation (Medical): Not on file  . Lack of Transportation (Non-Medical): Not on file  Physical Activity:   . Days of Exercise per Week: Not on file  . Minutes of Exercise per Session: Not on file  Stress:   . Feeling of Stress : Not on file  Social Connections:   . Frequency of Communication with Friends and Family: Not on file  . Frequency of Social Gatherings with Friends and Family: Not on file  . Attends Religious Services: Not on  file  . Active Member of Clubs or Organizations: Not on file  . Attends Banker Meetings: Not on file  . Marital Status: Not on file  Intimate Partner Violence:   . Fear of Current or Ex-Partner: Not on file  . Emotionally Abused: Not on file  . Physically Abused: Not on file  . Sexually Abused: Not on file    Outpatient Medications Prior to Visit  Medication Sig Dispense Refill  . amLODipine (NORVASC) 2.5 MG tablet TAKE 1 TABLET BY MOUTH EVERY DAY 90 tablet 3  . atorvastatin (LIPITOR) 20 MG tablet TAKE 1 TABLET BY MOUTH EVERY DAY 90 tablet 2  . Cholecalciferol (VITAMIN D3) 1000 units CAPS Take by mouth daily.    . fluticasone (FLONASE) 50 MCG/ACT nasal spray Place 1 spray into both nostrils daily.    Marland Kitchen levocetirizine (XYZAL) 5 MG tablet Take 5 mg by mouth every evening.    . metoprolol succinate (TOPROL-XL) 50 MG 24 hr tablet TAKE 1 TABLET BY MOUTH EVERY DAY (NEED APPT FOR MORE REFILLS) 90 tablet 0  . mometasone (NASONEX) 50 MCG/ACT nasal spray  PLACE 2 SPRAYS INTO THE NOSE DAILY. 17 g 1  . Multiple Vitamins-Minerals (WOMENS MULTIVITAMIN) TABS Take by mouth daily.    . mupirocin ointment (BACTROBAN) 2 % Apply 1 application topically 2 (two) times daily. Left lower chin 30 g 0  . valACYclovir (VALTREX) 1000 MG tablet Take 1 tablet (1,000 mg total) by mouth 2 (two) times daily. Bid x 7-10 days 60 tablet 0  . doxycycline (VIBRA-TABS) 100 MG tablet Take 1 tablet (100 mg total) by mouth 2 (two) times daily. X 7-10 days with food (Patient not taking: Reported on 02/02/2020) 20 tablet 0  . Estradiol 10 MCG TABS vaginal tablet Place 1 tablet (10 mcg total) vaginally daily. (Patient not taking: Reported on 02/02/2020) 30 tablet 0   No facility-administered medications prior to visit.    No Known Allergies  Review of Systems Pertinent positives noted in history of present illness and otherwise negative.   Objective:    Physical Exam Vitals reviewed.  Constitutional:      Appearance: Normal appearance. She is normal weight.  Musculoskeletal:        General: Normal range of motion.     Comments: Rt wrist is non-tender. Rt thumb is non-tender with normal ROM.   Skin:    General: Skin is warm and dry.     Comments: Rt wrist with small cyst noted ventral wrist.   Neurological:     General: No focal deficit present.     Mental Status: She is alert.     BP 120/70 (BP Location: Left Arm, Patient Position: Sitting, Cuff Size: Normal)   Pulse 65   Temp 97.7 F (36.5 C) (Oral)   Ht 5' 8.5" (1.74 m)   Wt 139 lb (63 kg)   SpO2 98%   BMI 20.83 kg/m  Wt Readings from Last 3 Encounters:  02/02/20 139 lb (63 kg)  11/25/19 140 lb 12.8 oz (63.9 kg)  07/22/19 142 lb 6.4 oz (64.6 kg)   Pulse Readings from Last 3 Encounters:  02/02/20 65  11/25/19 71  07/22/19 72    BP Readings from Last 3 Encounters:  02/02/20 120/70  11/25/19 120/84  07/22/19 114/78    Lab Results  Component Value Date   CHOL 173 07/22/2019   HDL 56.20  07/22/2019   LDLCALC 91 07/22/2019   LDLDIRECT 120.0 04/11/2015   TRIG 130.0 07/22/2019  CHOLHDL 3 07/22/2019     There are no preventive care reminders to display for this patient.  There are no preventive care reminders to display for this patient.  Lab Results  Component Value Date   TSH 2.20 07/03/2017   Lab Results  Component Value Date   WBC 6.3 07/03/2017   HGB 13.2 07/03/2017   HCT 38.7 07/03/2017   MCV 87.8 07/03/2017   PLT 276.0 07/03/2017   Lab Results  Component Value Date   NA 139 07/22/2019   K 4.3 07/22/2019   CO2 30 07/22/2019   GLUCOSE 77 07/22/2019   BUN 18 07/22/2019   CREATININE 0.73 07/22/2019   BILITOT 1.2 07/22/2019   ALKPHOS 54 07/22/2019   AST 21 07/22/2019   ALT 21 07/22/2019   PROT 7.3 07/22/2019   ALBUMIN 4.4 07/22/2019   CALCIUM 9.4 07/22/2019   GFR 80.89 07/22/2019   Lab Results  Component Value Date   CHOL 173 07/22/2019   Lab Results  Component Value Date   HDL 56.20 07/22/2019   Lab Results  Component Value Date   LDLCALC 91 07/22/2019   Lab Results  Component Value Date   TRIG 130.0 07/22/2019   Lab Results  Component Value Date   CHOLHDL 3 07/22/2019   Lab Results  Component Value Date   HGBA1C 5.8 07/03/2017      Assessment & Plan:   Problem List Items Addressed This Visit      Other   Ganglion cyst of volar aspect of right wrist - Primary    Other Visit Diagnoses    Need for immunization against influenza       Relevant Orders   Flu Vaccine QUAD 36+ mos IM (Completed)     Patient has a cyst on her right ventral wrist present since April and would like to have it removed. It ss not causing her any pain or discomfort. I will inquire about a hand surgeon  for referral.  No orders of the defined types were placed in this encounter. referral for her wrist cyst:  Dr. Dominica Severin  Address: [address], [place], [place] [address] Hours:  Open ? Closes 5PM Health & safety: Appointment  required  Mask required  Staff wear masks  More details Phone: [phone] Check insurance info Appointments: [email or web address]   Follow-up: Return if symptoms worsen or fail to improve.  This visit occurred during the SARS-CoV-2 public health emergency.  Safety protocols were in place, including screening questions prior to the visit, additional usage of staff PPE, and extensive cleaning of exam room while observing appropriate contact time as indicated for disinfecting solutions.   Amedeo Kinsman, NP

## 2020-02-03 ENCOUNTER — Encounter: Payer: Self-pay | Admitting: Nurse Practitioner

## 2020-02-04 NOTE — Addendum Note (Signed)
Addended by: Amedeo Kinsman A on: 02/04/2020 02:01 PM   Modules accepted: Orders

## 2020-02-07 ENCOUNTER — Other Ambulatory Visit: Payer: Self-pay | Admitting: Family

## 2020-02-07 DIAGNOSIS — I10 Essential (primary) hypertension: Secondary | ICD-10-CM

## 2020-02-15 ENCOUNTER — Ambulatory Visit
Admission: RE | Admit: 2020-02-15 | Discharge: 2020-02-15 | Disposition: A | Discharge status: Home / Self Care | Payer: BC Managed Care – PPO | Source: Ambulatory Visit | Attending: Family | Admitting: Family

## 2020-02-15 DIAGNOSIS — Z1231 Encounter for screening mammogram for malignant neoplasm of breast: Secondary | ICD-10-CM | POA: Diagnosis not present

## 2020-02-17 ENCOUNTER — Encounter: Payer: Self-pay | Admitting: Family

## 2020-02-17 ENCOUNTER — Other Ambulatory Visit: Payer: Self-pay | Admitting: Family

## 2020-02-17 DIAGNOSIS — M67431 Ganglion, right wrist: Secondary | ICD-10-CM

## 2020-02-29 ENCOUNTER — Other Ambulatory Visit: Payer: Self-pay | Admitting: Family

## 2020-02-29 DIAGNOSIS — N952 Postmenopausal atrophic vaginitis: Secondary | ICD-10-CM

## 2020-02-29 DIAGNOSIS — I10 Essential (primary) hypertension: Secondary | ICD-10-CM

## 2020-02-29 NOTE — Telephone Encounter (Signed)
Please advise to refill 

## 2020-03-02 ENCOUNTER — Telehealth: Payer: Self-pay | Admitting: Family

## 2020-03-02 NOTE — Telephone Encounter (Signed)
Patient returned call did not get a message

## 2020-03-02 NOTE — Telephone Encounter (Signed)
Call pt We rec'd request for refill of Yuvafem. She had been on Premarin vaginal estrogen Has she changed brands? How many time per week does she use?

## 2020-03-02 NOTE — Telephone Encounter (Signed)
LM once again fir patient to call back.

## 2020-03-02 NOTE — Telephone Encounter (Signed)
LMTCB

## 2020-03-03 ENCOUNTER — Other Ambulatory Visit: Payer: Self-pay

## 2020-03-03 NOTE — Telephone Encounter (Signed)
Since patient logged into mychart just yesterday I have sent her a message through the portal.

## 2020-03-04 ENCOUNTER — Encounter: Payer: Self-pay | Admitting: Family

## 2020-03-04 ENCOUNTER — Other Ambulatory Visit: Payer: Self-pay | Admitting: Family

## 2020-03-04 DIAGNOSIS — N952 Postmenopausal atrophic vaginitis: Secondary | ICD-10-CM

## 2020-03-04 MED ORDER — ESTROGENS, CONJUGATED 0.625 MG/GM VA CREA: TOPICAL_CREAM | VAGINAL | 3 refills | Status: DC

## 2020-03-08 ENCOUNTER — Other Ambulatory Visit: Payer: Self-pay | Admitting: Family

## 2020-03-08 DIAGNOSIS — N952 Postmenopausal atrophic vaginitis: Secondary | ICD-10-CM

## 2020-03-08 MED ORDER — ESTRADIOL 10 MCG VA TABS: 1.0000 | ORAL_TABLET | VAGINAL | 1 refills | Status: DC

## 2020-03-08 NOTE — Progress Notes (Signed)
y

## 2020-06-27 ENCOUNTER — Other Ambulatory Visit: Payer: Self-pay | Admitting: Family

## 2020-07-04 ENCOUNTER — Encounter: Payer: BC Managed Care – PPO | Admitting: Family

## 2020-07-26 ENCOUNTER — Other Ambulatory Visit: Payer: Self-pay

## 2020-07-26 ENCOUNTER — Other Ambulatory Visit (HOSPITAL_COMMUNITY)
Admission: RE | Admit: 2020-07-26 | Discharge: 2020-07-26 | Disposition: A | Discharge status: Home / Self Care | Payer: BC Managed Care – PPO | Source: Ambulatory Visit | Attending: Family | Admitting: Family

## 2020-07-26 ENCOUNTER — Other Ambulatory Visit: Payer: Self-pay | Admitting: Family

## 2020-07-26 ENCOUNTER — Ambulatory Visit (INDEPENDENT_AMBULATORY_CARE_PROVIDER_SITE_OTHER): Payer: BC Managed Care – PPO | Admitting: Family

## 2020-07-26 ENCOUNTER — Encounter: Payer: Self-pay | Admitting: Family

## 2020-07-26 VITALS — BP 108/70 | HR 68 | Temp 98.1°F | Ht 67.98 in | Wt 138.0 lb

## 2020-07-26 DIAGNOSIS — I10 Essential (primary) hypertension: Secondary | ICD-10-CM

## 2020-07-26 DIAGNOSIS — N952 Postmenopausal atrophic vaginitis: Secondary | ICD-10-CM | POA: Diagnosis not present

## 2020-07-26 DIAGNOSIS — Z0000 Encounter for general adult medical examination without abnormal findings: Secondary | ICD-10-CM

## 2020-07-26 DIAGNOSIS — E785 Hyperlipidemia, unspecified: Secondary | ICD-10-CM

## 2020-07-26 LAB — COMPREHENSIVE METABOLIC PANEL
ALT: 19 U/L (ref 0–35)
AST: 23 U/L (ref 0–37)
Albumin: 4.3 g/dL (ref 3.5–5.2)
Alkaline Phosphatase: 54 U/L (ref 39–117)
BUN: 18 mg/dL (ref 6–23)
CO2: 29 mEq/L (ref 19–32)
Calcium: 9.4 mg/dL (ref 8.4–10.5)
Chloride: 102 mEq/L (ref 96–112)
Creatinine, Ser: 0.83 mg/dL (ref 0.40–1.20)
GFR: 75.46 mL/min (ref >60.00)
Glucose, Bld: 76 mg/dL (ref 70–99)
Potassium: 3.9 mEq/L (ref 3.5–5.1)
Sodium: 138 mEq/L (ref 135–145)
Total Bilirubin: 1.3 mg/dL — ABNORMAL HIGH (ref 0.2–1.2)
Total Protein: 7.2 g/dL (ref 6.0–8.3)

## 2020-07-26 LAB — LIPID PANEL
Cholesterol: 167 mg/dL (ref 0–200)
HDL: 64.3 mg/dL (ref >39.00)
LDL Cholesterol: 83 mg/dL (ref 0–99)
NonHDL: 102.42
Total CHOL/HDL Ratio: 3
Triglycerides: 97 mg/dL (ref 0.0–149.0)
VLDL: 19.4 mg/dL (ref 0.0–40.0)

## 2020-07-26 LAB — CBC WITH DIFFERENTIAL/PLATELET
Basophils Absolute: 0 10*3/uL (ref 0.0–0.1)
Basophils Relative: 0.7 % (ref 0.0–3.0)
Eosinophils Absolute: 0.1 10*3/uL (ref 0.0–0.7)
Eosinophils Relative: 2.5 % (ref 0.0–5.0)
HCT: 39.4 % (ref 36.0–46.0)
Hemoglobin: 13.4 g/dL (ref 12.0–15.0)
Lymphocytes Relative: 39.8 % (ref 12.0–46.0)
Lymphs Abs: 2.3 10*3/uL (ref 0.7–4.0)
MCHC: 34 g/dL (ref 30.0–36.0)
MCV: 89.5 fl (ref 78.0–100.0)
Monocytes Absolute: 0.4 10*3/uL (ref 0.1–1.0)
Monocytes Relative: 6.4 % (ref 3.0–12.0)
Neutro Abs: 3 10*3/uL (ref 1.4–7.7)
Neutrophils Relative %: 50.6 % (ref 43.0–77.0)
Platelets: 208 10*3/uL (ref 150.0–400.0)
RBC: 4.4 Mil/uL (ref 3.87–5.11)
RDW: 13.9 % (ref 11.5–15.5)
WBC: 5.9 10*3/uL (ref 4.0–10.5)

## 2020-07-26 LAB — VITAMIN D 25 HYDROXY (VIT D DEFICIENCY, FRACTURES): VITD: 37.89 ng/mL (ref 30.00–100.00)

## 2020-07-26 LAB — TSH: TSH: 1.59 u[IU]/mL (ref 0.35–4.50)

## 2020-07-26 MED ORDER — ESTROGENS, CONJUGATED 0.625 MG/GM VA CREA: TOPICAL_CREAM | VAGINAL | 2 refills | Status: DC

## 2020-07-26 NOTE — Patient Instructions (Signed)
Referral for colonoscopy Let us know if you dont hear back within a week in regards to an appointment being scheduled.    Health Maintenance for Postmenopausal Women Menopause is a normal process in which your ability to get pregnant comes to an end. This process happens slowly over many months or years, usually between the ages of 86 and 83. Menopause is complete when you have missed your menstrual periods for 12 months. It is important to talk with your health care provider about some of the most common conditions that affect women after menopause (postmenopausal women). These include heart disease, cancer, and bone loss (osteoporosis). Adopting a healthy lifestyle and getting preventive care can help to promote your health and wellness. The actions you take can also lower your chances of developing some of these common conditions. What should I know about menopause? During menopause, you may get a number of symptoms, such as:  Hot flashes. These can be moderate or severe.  Night sweats.  Decrease in sex drive.  Mood swings.  Headaches.  Tiredness.  Irritability.  Memory problems.  Insomnia. Choosing to treat or not to treat these symptoms is a decision that you make with your health care provider. Do I need hormone replacement therapy?  Hormone replacement therapy is effective in treating symptoms that are caused by menopause, such as hot flashes and night sweats.  Hormone replacement carries certain risks, especially as you become older. If you are thinking about using estrogen or estrogen with progestin, discuss the benefits and risks with your health care provider. What is my risk for heart disease and stroke? The risk of heart disease, heart attack, and stroke increases as you age. One of the causes may be a change in the body's hormones during menopause. This can affect how your body uses dietary fats, triglycerides, and cholesterol. Heart attack and stroke are medical  emergencies. There are many things that you can do to help prevent heart disease and stroke. Watch your blood pressure  High blood pressure causes heart disease and increases the risk of stroke. This is more likely to develop in people who have high blood pressure readings, are of African descent, or are overweight.  Have your blood pressure checked: ? Every 3-5 years if you are 64-28 years of age. ? Every year if you are 46 years old or older. Eat a healthy diet  Eat a diet that includes plenty of vegetables, fruits, low-fat dairy products, and lean protein.  Do not eat a lot of foods that are high in solid fats, added sugars, or sodium.   Get regular exercise Get regular exercise. This is one of the most important things you can do for your health. Most adults should:  Try to exercise for at least 150 minutes each week. The exercise should increase your heart rate and make you sweat (moderate-intensity exercise).  Try to do strengthening exercises at least twice each week. Do these in addition to the moderate-intensity exercise.  Spend less time sitting. Even light physical activity can be beneficial. Other tips  Work with your health care provider to achieve or maintain a healthy weight.  Do not use any products that contain nicotine or tobacco, such as cigarettes, e-cigarettes, and chewing tobacco. If you need help quitting, ask your health care provider.  Know your numbers. Ask your health care provider to check your cholesterol and your blood sugar (glucose). Continue to have your blood tested as directed by your health care provider. Do I need  screening for cancer? Depending on your health history and family history, you may need to have cancer screening at different stages of your life. This may include screening for:  Breast cancer.  Cervical cancer.  Lung cancer.  Colorectal cancer. What is my risk for osteoporosis? After menopause, you may be at increased risk for  osteoporosis. Osteoporosis is a condition in which bone destruction happens more quickly than new bone creation. To help prevent osteoporosis or the bone fractures that can happen because of osteoporosis, you may take the following actions:  If you are 74-3 years old, get at least 1,000 mg of calcium and at least 600 mg of vitamin D per day.  If you are older than age 57 but younger than age 27, get at least 1,200 mg of calcium and at least 600 mg of vitamin D per day.  If you are older than age 87, get at least 1,200 mg of calcium and at least 800 mg of vitamin D per day. Smoking and drinking excessive alcohol increase the risk of osteoporosis. Eat foods that are rich in calcium and vitamin D, and do weight-bearing exercises several times each week as directed by your health care provider. How does menopause affect my mental health? Depression may occur at any age, but it is more common as you become older. Common symptoms of depression include:  Low or sad mood.  Changes in sleep patterns.  Changes in appetite or eating patterns.  Feeling an overall lack of motivation or enjoyment of activities that you previously enjoyed.  Frequent crying spells. Talk with your health care provider if you think that you are experiencing depression. General instructions See your health care provider for regular wellness exams and vaccines. This may include:  Scheduling regular health, dental, and eye exams.  Getting and maintaining your vaccines. These include: ? Influenza vaccine. Get this vaccine each year before the flu season begins. ? Pneumonia vaccine. ? Shingles vaccine. ? Tetanus, diphtheria, and pertussis (Tdap) booster vaccine. Your health care provider may also recommend other immunizations. Tell your health care provider if you have ever been abused or do not feel safe at home. Summary  Menopause is a normal process in which your ability to get pregnant comes to an end.  This  condition causes hot flashes, night sweats, decreased interest in sex, mood swings, headaches, or lack of sleep.  Treatment for this condition may include hormone replacement therapy.  Take actions to keep yourself healthy, including exercising regularly, eating a healthy diet, watching your weight, and checking your blood pressure and blood sugar levels.  Get screened for cancer and depression. Make sure that you are up to date with all your vaccines. This information is not intended to replace advice given to you by your health care provider. Make sure you discuss any questions you have with your health care provider. Document Revised: 04/02/2018 Document Reviewed: 04/02/2018 Elsevier Patient Education  2021 [employer].

## 2020-07-26 NOTE — Assessment & Plan Note (Signed)
Anticipate controlled. Continue lipitor 20mg 

## 2020-07-26 NOTE — Assessment & Plan Note (Signed)
CBE and pap smear performed today. Encouraged continued exercise.

## 2020-07-26 NOTE — Assessment & Plan Note (Addendum)
Controlled. Discussed risk of breast cancer,MI, CVA, endometrial cancer. BP well controlled and no first degree relatives with GYN or breast cancer. She feels comfortable continuing premarin and using at lowest dose, once per week. We will continue to discuss medication however jointly agreed to continue at this time.

## 2020-07-26 NOTE — Assessment & Plan Note (Signed)
Excellent control.  Continue amlodipine 2.5 mg 

## 2020-07-26 NOTE — Progress Notes (Signed)
Subjective:    Patient ID: Natasha Pace, female    DOB: [birth year], 65 y.o.   MRN: [id number]  CC: Natasha Pace is a 65 y.o. female who presents today for physical exam and follow up.    HPI: Feels well today No complaints  No depression. Sleeping well  HTN- compliant with norvasc 2.5mg  . No cp.   HLD- compliant with lipitor 20mg   Vaginal atrophy- for some reason she was switched to vagifem; she had been on premarin vaginally once per week and would like refill today. No vaginal bleeding , dyspareunia. H/o of breast and ovarian cancer in family. No personal h/o cancer.     Colorectal Cancer Screening: due Breast Cancer Screening: Mammogram UTD Cervical Cancer Screening: due Bone Health screening/DEXA for 65+: No increased fracture risk. Defer screening at this time.  Lung Cancer Screening: Doesn't have 20 year pack year history and age > 108 years yo 26 years          Tetanus - utd         Labs: Screening labs today. Exercise: Gets regular exercise by walking daily Alcohol use:  occassional Smoking/tobacco use: Nonsmoker.     HISTORY:  Past Medical History:  Diagnosis Date  . Allergy    hay fever  . Chicken pox   . GERD (gastroesophageal reflux disease)   . Hyperlipidemia   . Hypertension   . Vaginal polyp 2014   benign    Past Surgical History:  Procedure Laterality Date  . INTRAUTERINE DEVICE INSERTION     Removed 2014, Mirena, [facility], [facility] [place]  . VAGINAL DELIVERY     2   Family History  Problem Relation Age of Onset  . Hypertension Mother   . Diabetes Maternal Grandmother   . Cancer Maternal Grandmother        breast  . Breast cancer Maternal Grandmother 47  . Hypertension Brother   . Hypertension Father   . Diabetes Maternal Grandfather   . Ovarian cancer Other 74       ovarian/uterus, Aunt,       ALLERGIES: Patient has no known allergies.  Current Outpatient Medications on File Prior to Visit  Medication Sig Dispense Refill   . amLODipine (NORVASC) 2.5 MG tablet TAKE 1 TABLET BY MOUTH EVERY DAY 90 tablet 3  . atorvastatin (LIPITOR) 20 MG tablet TAKE 1 TABLET BY MOUTH EVERY DAY 90 tablet 2  . Cholecalciferol (VITAMIN D3) 1000 units CAPS Take by mouth daily.    . fluticasone (FLONASE) 50 MCG/ACT nasal spray Place 1 spray into both nostrils daily.    88 levocetirizine (XYZAL) 5 MG tablet Take 5 mg by mouth every evening.    . metoprolol succinate (TOPROL-XL) 50 MG 24 hr tablet TAKE 1 TABLET BY MOUTH EVERY DAY (NEED APPT FOR MORE REFILLS) 90 tablet 1  . mometasone (NASONEX) 50 MCG/ACT nasal spray PLACE 2 SPRAYS INTO THE NOSE DAILY. 17 g 1  . Multiple Vitamins-Minerals (WOMENS MULTIVITAMIN) TABS Take by mouth daily.    . mupirocin ointment (BACTROBAN) 2 % Apply 1 application topically 2 (two) times daily. Left lower chin 30 g 0   No current facility-administered medications on file prior to visit.    Social History   Tobacco Use  . Smoking status: Never Smoker  . Smokeless tobacco: Never Used  Substance Use Topics  . Alcohol use: Yes    Comment: 3-4 days per week, one glass of wine.     Review of Systems  Constitutional: Negative for chills, fever and unexpected weight change.  HENT: Negative for congestion.   Respiratory: Negative for cough.   Cardiovascular: Negative for chest pain, palpitations and leg swelling.  Gastrointestinal: Negative for nausea and vomiting.  Genitourinary: Negative for dyspareunia.  Musculoskeletal: Negative for arthralgias and myalgias.  Skin: Negative for rash.  Neurological: Negative for headaches.  Hematological: Negative for adenopathy.  Psychiatric/Behavioral: Negative for confusion.      Objective:    BP 108/70   Pulse 68   Temp 98.1 F (36.7 C)   Ht 5' 7.98" (1.727 m)   Wt 138 lb (62.6 kg)   SpO2 99%   BMI 21.00 kg/m   BP Readings from Last 3 Encounters:  07/26/20 108/70  02/02/20 120/70  11/25/19 120/84   Wt Readings from Last 3 Encounters:  07/26/20  138 lb (62.6 kg)  02/02/20 139 lb (63 kg)  11/25/19 140 lb 12.8 oz (63.9 kg)    Physical Exam Vitals reviewed.  Constitutional:      Appearance: She is well-developed.  Eyes:     Conjunctiva/sclera: Conjunctivae normal.  Neck:     Thyroid: No thyroid mass or thyromegaly.  Cardiovascular:     Rate and Rhythm: Normal rate and regular rhythm.     Pulses: Normal pulses.     Heart sounds: Normal heart sounds.  Pulmonary:     Effort: Pulmonary effort is normal.     Breath sounds: Normal breath sounds. No wheezing, rhonchi or rales.  Chest:  Breasts: Breasts are symmetrical.     Right: No inverted nipple, mass, nipple discharge, skin change or tenderness.     Left: No inverted nipple, mass, nipple discharge, skin change or tenderness.    Genitourinary:    Cervix: No cervical motion tenderness, discharge or friability.     Uterus: Not enlarged, not fixed and not tender.      Adnexa:        Right: No mass, tenderness or fullness.         Left: No mass, tenderness or fullness.       Comments: Pap performed. No CMT. Unable to appreciated ovaries. Lymphadenopathy:     Head:     Right side of head: No submental, submandibular, tonsillar, preauricular, posterior auricular or occipital adenopathy.     Left side of head: No submental, submandibular, tonsillar, preauricular, posterior auricular or occipital adenopathy.     Cervical:     Right cervical: No superficial, deep or posterior cervical adenopathy.    Left cervical: No superficial, deep or posterior cervical adenopathy.     Upper Body:     Right upper body: No pectoral adenopathy.     Left upper body: No pectoral adenopathy.  Skin:    General: Skin is warm and dry.  Neurological:     Mental Status: She is alert.  Psychiatric:        Speech: Speech normal.        Behavior: Behavior normal.        Thought Content: Thought content normal.        Assessment & Plan:   Problem List Items Addressed This Visit       Cardiovascular and Mediastinum   Hypertension    Excellent control.  Continue amlodipine 2.5mg         Genitourinary   Atrophic vaginitis    Controlled. Discussed risk of breast cancer,MI, CVA, endometrial cancer. BP well controlled and no first degree relatives with GYN or breast cancer. She feels comfortable  continuing premarin and using at lowest dose, once per week. We will continue to discuss medication however jointly agreed to continue at this time.       Relevant Medications   conjugated estrogens (PREMARIN) vaginal cream     Other   Hyperlipidemia with target low density lipoprotein (LDL) cholesterol less than 100 mg/dL    Anticipate controlled. Continue lipitor 20mg       Routine general medical examination at a health care facility - Primary    CBE and pap smear performed today. Encouraged continued exercise.       Relevant Orders   Cytology - PAP   TSH   CBC with Differential/Platelet   Comprehensive metabolic panel   Lipid panel   VITAMIN D 25 Hydroxy (Vit-D Deficiency, Fractures)   Ambulatory referral to Gastroenterology       I have discontinued Cassidy Tashiro. Haven's valACYclovir and Estradiol. I am also having her start on conjugated estrogens. Additionally, I am having her maintain her Womens Multivitamin, Vitamin D3, levocetirizine, fluticasone, mometasone, mupirocin ointment, amLODipine, metoprolol succinate, and atorvastatin.   Meds ordered this encounter  Medications  . conjugated estrogens (PREMARIN) vaginal cream    Sig: 0.5 g intravaginally twice per week then taper to maintenance dose.    Dispense:  30 g    Refill:  2    Order Specific Question:   Supervising Provider    Answer:   Birdie Hopes [[id number]]    Return precautions given.   Risks, benefits, and alternatives of the medications and treatment plan prescribed today were discussed, and patient expressed understanding.   Education regarding symptom management and diagnosis given to patient on  AVS.   Continue to follow with Sherlene Shams, FNP for routine health maintenance.   Allegra Grana and I agreed with plan.   Trixie Rude, FNP

## 2020-07-27 ENCOUNTER — Other Ambulatory Visit: Payer: Self-pay | Admitting: Family

## 2020-07-27 DIAGNOSIS — N952 Postmenopausal atrophic vaginitis: Secondary | ICD-10-CM

## 2020-07-27 MED ORDER — ESTRADIOL 10 MCG VA TABS: 10.0000 ug | ORAL_TABLET | VAGINAL | 1 refills | Status: DC

## 2020-07-27 NOTE — Telephone Encounter (Signed)
Call pt Premarin was not approved with insurance Per formulary, I have sent in yuvafem vaginal tablet  

## 2020-07-27 NOTE — Progress Notes (Signed)
Call pt Premarin was not approved with insurance Per formulary, I have sent in yuvafem vaginal tablet

## 2020-07-28 LAB — CYTOLOGY - PAP
Comment: NEGATIVE
Diagnosis: UNDETERMINED — AB
High risk HPV: NEGATIVE

## 2020-08-01 ENCOUNTER — Other Ambulatory Visit: Payer: Self-pay | Admitting: Family

## 2020-08-01 ENCOUNTER — Encounter: Payer: Self-pay | Admitting: Family

## 2020-08-01 ENCOUNTER — Other Ambulatory Visit: Payer: Self-pay

## 2020-08-01 DIAGNOSIS — R17 Unspecified jaundice: Secondary | ICD-10-CM

## 2020-08-01 DIAGNOSIS — R300 Dysuria: Secondary | ICD-10-CM

## 2020-08-01 NOTE — Telephone Encounter (Signed)
I have called patient to advise on below message. She is scheduled to come in tomorrow for repeats labs & to leave her urine.

## 2020-08-02 ENCOUNTER — Other Ambulatory Visit (INDEPENDENT_AMBULATORY_CARE_PROVIDER_SITE_OTHER): Payer: BC Managed Care – PPO

## 2020-08-02 ENCOUNTER — Other Ambulatory Visit: Payer: Self-pay

## 2020-08-02 DIAGNOSIS — R300 Dysuria: Secondary | ICD-10-CM | POA: Diagnosis not present

## 2020-08-02 DIAGNOSIS — R17 Unspecified jaundice: Secondary | ICD-10-CM | POA: Diagnosis not present

## 2020-08-02 LAB — GAMMA GT: GGT: 23 U/L (ref 7–51)

## 2020-08-02 LAB — URINALYSIS, ROUTINE W REFLEX MICROSCOPIC
Bilirubin Urine: NEGATIVE
Hgb urine dipstick: NEGATIVE
Ketones, ur: NEGATIVE
Leukocytes,Ua: NEGATIVE
Nitrite: NEGATIVE
RBC / HPF: NONE SEEN (ref 0–?)
Specific Gravity, Urine: <=1.005 — AB (ref 1.000–1.030)
Total Protein, Urine: NEGATIVE
Urine Glucose: NEGATIVE
Urobilinogen, UA: 0.2 (ref 0.0–1.0)
WBC, UA: NONE SEEN (ref 0–?)
pH: 6 (ref 5.0–8.0)

## 2020-08-03 LAB — URINE CULTURE
MICRO NUMBER:: 11759973
Result:: NO GROWTH
SPECIMEN QUALITY:: ADEQUATE

## 2020-08-03 LAB — BILIRUBIN, FRACTIONATED(TOT/DIR/INDIR)
Bilirubin, Direct: 0.2 mg/dL (ref 0.0–0.2)
Indirect Bilirubin: 1 mg/dL (calc) (ref 0.2–1.2)
Total Bilirubin: 1.2 mg/dL (ref 0.2–1.2)

## 2020-08-05 ENCOUNTER — Other Ambulatory Visit: Payer: Self-pay | Admitting: Family

## 2020-08-05 DIAGNOSIS — I10 Essential (primary) hypertension: Secondary | ICD-10-CM

## 2020-08-11 ENCOUNTER — Encounter: Payer: Self-pay | Admitting: *Deleted

## 2020-08-31 ENCOUNTER — Telehealth: Payer: Self-pay

## 2020-08-31 NOTE — Telephone Encounter (Signed)
Please open an attach appt to the referral

## 2020-10-11 ENCOUNTER — Ambulatory Visit (INDEPENDENT_AMBULATORY_CARE_PROVIDER_SITE_OTHER): Payer: BC Managed Care – PPO | Admitting: Gastroenterology

## 2020-10-11 DIAGNOSIS — Z1211 Encounter for screening for malignant neoplasm of colon: Secondary | ICD-10-CM

## 2020-10-11 MED ORDER — CLENPIQ 10-3.5-12 MG-GM -GM/160ML PO SOLN: 1.0000 | Freq: Once | ORAL | 0 refills | Status: AC

## 2020-10-11 NOTE — Progress Notes (Deleted)
Gastroenterology Pre-Procedure Review  Request Date: 11/03/20 Requesting Physician: Dr. Allegra Lai  PATIENT REVIEW QUESTIONS: The patient responded to the following health history questions as indicated:    1. Are you having any GI issues? no 2. Do you have a personal history of Polyps? Yes 04/23/2010. 3. Do you have a family history of Colon Cancer or Polyps? no 4. Diabetes Mellitus? no 5. Joint replacements in the past 12 months?no 6. Major health problems in the past 3 months?no 7. Any artificial heart valves, MVP, or defibrillator?no    MEDICATIONS & ALLERGIES:    Patient reports the following regarding taking any anticoagulation/antiplatelet therapy:   Plavix, Coumadin, Eliquis, Xarelto, Lovenox, Pradaxa, Brilinta, or Effient? no Aspirin? no  Patient confirms/reports the following medications:  Current Outpatient Medications  Medication Sig Dispense Refill   amLODipine (NORVASC) 2.5 MG tablet TAKE 1 TABLET BY MOUTH EVERY DAY 90 tablet 3   atorvastatin (LIPITOR) 20 MG tablet TAKE 1 TABLET BY MOUTH EVERY DAY 90 tablet 2   Cholecalciferol (VITAMIN D3) 1000 units CAPS Take by mouth daily.     Estradiol (YUVAFEM) 10 MCG TABS vaginal tablet Place 1 tablet (10 mcg total) vaginally once a week. 16 tablet 1   fluticasone (FLONASE) 50 MCG/ACT nasal spray Place 1 spray into both nostrils daily.     levocetirizine (XYZAL) 5 MG tablet Take 5 mg by mouth every evening.     metoprolol succinate (TOPROL-XL) 50 MG 24 hr tablet TAKE 1 TABLET BY MOUTH EVERY DAY (NEED APPT FOR MORE REFILLS) 90 tablet 1   mometasone (NASONEX) 50 MCG/ACT nasal spray PLACE 2 SPRAYS INTO THE NOSE DAILY. 17 g 1   Multiple Vitamins-Minerals (WOMENS MULTIVITAMIN) TABS Take by mouth daily.     mupirocin ointment (BACTROBAN) 2 % Apply 1 application topically 2 (two) times daily. Left lower chin 30 g 0   No current facility-administered medications for this visit.    Patient confirms/reports the following allergies:  No  Known Allergies  No orders of the defined types were placed in this encounter.   AUTHORIZATION INFORMATION Primary Insurance: 1D#: Group #:  Secondary Insurance: 1D#: Group #:  SCHEDULE INFORMATION: Date: 11/03/20 Time: Location:[facility]

## 2020-10-12 ENCOUNTER — Telehealth: Payer: Self-pay | Admitting: Gastroenterology

## 2020-10-12 NOTE — Telephone Encounter (Signed)
Called and left a message.

## 2020-10-12 NOTE — Telephone Encounter (Signed)
Call patient to reschedule her procedure

## 2020-11-09 ENCOUNTER — Ambulatory Visit: Payer: BC Managed Care – PPO | Admitting: Anesthesiology

## 2020-11-09 ENCOUNTER — Encounter: Payer: Self-pay | Admitting: Gastroenterology

## 2020-11-09 ENCOUNTER — Ambulatory Visit
Admission: RE | Admit: 2020-11-09 | Discharge: 2020-11-09 | Disposition: A | Discharge status: Home / Self Care | Payer: BC Managed Care – PPO | Attending: Gastroenterology | Admitting: Gastroenterology

## 2020-11-09 ENCOUNTER — Encounter
Admission: RE | Disposition: A | Discharge status: Home / Self Care | Payer: Self-pay | Source: Home / Self Care | Attending: Gastroenterology

## 2020-11-09 DIAGNOSIS — Z79899 Other long term (current) drug therapy: Secondary | ICD-10-CM | POA: Diagnosis not present

## 2020-11-09 DIAGNOSIS — Z1211 Encounter for screening for malignant neoplasm of colon: Secondary | ICD-10-CM | POA: Insufficient documentation

## 2020-11-09 DIAGNOSIS — K5730 Diverticulosis of large intestine without perforation or abscess without bleeding: Secondary | ICD-10-CM | POA: Diagnosis not present

## 2020-11-09 DIAGNOSIS — K644 Residual hemorrhoidal skin tags: Secondary | ICD-10-CM | POA: Diagnosis not present

## 2020-11-09 HISTORY — PX: COLONOSCOPY WITH PROPOFOL: SHX5780

## 2020-11-09 SURGERY — COLONOSCOPY WITH PROPOFOL
Anesthesia: General

## 2020-11-09 MED ORDER — SODIUM CHLORIDE 0.9 % IV SOLN: INTRAVENOUS | Status: DC

## 2020-11-09 MED ORDER — PROPOFOL 500 MG/50ML IV EMUL
INTRAVENOUS | Status: DC | PRN
  Administered 2020-11-09: 150 ug/kg/min via INTRAVENOUS

## 2020-11-09 MED ORDER — PROPOFOL 10 MG/ML IV BOLUS
INTRAVENOUS | Status: DC | PRN
  Administered 2020-11-09 (×4): 20 mg via INTRAVENOUS

## 2020-11-09 MED ORDER — LIDOCAINE HCL (CARDIAC) PF 100 MG/5ML IV SOSY
PREFILLED_SYRINGE | INTRAVENOUS | Status: DC | PRN
  Administered 2020-11-09: 20 mg via INTRAVENOUS
  Administered 2020-11-09: 80 mg via INTRAVENOUS

## 2020-11-09 NOTE — H&P (Signed)
[name], MD [address]  [address]  [place], [place] [address]  Main: [phone]  Fax: [phone] Pager: [phone]  Primary Care Physician:  Allegra Grana, FNP Primary Gastroenterologist:  Dr. [name]  Pre-Procedure History & Physical: HPI:  Natasha Pace is a 65 y.o. female is here for an colonoscopy.   Past Medical History:  Diagnosis Date   Allergy    hay fever   Chicken pox    GERD (gastroesophageal reflux disease)    Hyperlipidemia    Hypertension    Vaginal polyp 2014   benign    Past Surgical History:  Procedure Laterality Date   INTRAUTERINE DEVICE INSERTION     Removed 2014, Mirena, [facility], [facility]   VAGINAL DELIVERY     2    Prior to Admission medications   Medication Sig Start Date End Date Taking? Authorizing Provider  amLODipine (NORVASC) 2.5 MG tablet TAKE 1 TABLET BY MOUTH EVERY DAY 12/25/19  Yes Allegra Grana, FNP  atorvastatin (LIPITOR) 20 MG tablet TAKE 1 TABLET BY MOUTH EVERY DAY 06/27/20  Yes Allegra Grana, FNP  Cholecalciferol (VITAMIN D3) 1000 units CAPS Take by mouth daily.   Yes [provider]  Estradiol (YUVAFEM) 10 MCG TABS vaginal tablet Place 1 tablet (10 mcg total) vaginally once a week. 07/27/20  Yes Allegra Grana, FNP  famotidine (PEPCID) 20 MG tablet Take 20 mg by mouth 2 (two) times daily.   Yes [provider]  fluticasone (FLONASE) 50 MCG/ACT nasal spray Place 1 spray into both nostrils daily.   Yes [provider]  levocetirizine (XYZAL) 5 MG tablet Take 5 mg by mouth every evening.   Yes [provider]  metoprolol succinate (TOPROL-XL) 50 MG 24 hr tablet TAKE 1 TABLET BY MOUTH EVERY DAY (NEED APPT FOR MORE REFILLS) 08/08/20  Yes Arnett, Lyn Records, FNP  mupirocin ointment (BACTROBAN) 2 % Apply 1 application topically 2 (two) times daily. Left lower chin 11/25/19  Yes McLean-Scocuzza, Pasty Spillers, MD  mometasone (NASONEX) 50 MCG/ACT nasal spray PLACE 2  SPRAYS INTO THE NOSE DAILY. Patient not taking: Reported on 11/09/2020 05/06/19   Allegra Grana, FNP  Multiple Vitamins-Minerals (WOMENS MULTIVITAMIN) TABS Take by mouth daily.    [provider]    Allergies as of 10/11/2020   (No Known Allergies)    Family History  Problem Relation Age of Onset   Hypertension Mother    Diabetes Maternal Grandmother    Cancer Maternal Grandmother        breast   Breast cancer Maternal Grandmother 78   Hypertension Brother    Hypertension Father    Diabetes Maternal Grandfather    Ovarian cancer Other 10       ovarian/uterus, Aunt,     Social History   Socioeconomic History   Marital status: Married    Spouse name: Not on file   Number of children: Not on file   Years of education: Not on file   Highest education level: Not on file  Occupational History   Not on file  Tobacco Use   Smoking status: Never   Smokeless tobacco: Never  Vaping Use   Vaping Use: Never used  Substance and Sexual Activity   Alcohol use: Yes    Comment: 3-4 days per week, one glass of wine.    Drug use: Never   Sexual activity: Not on file  Other Topics Concern   Not on file  Social History Narrative   Lives in [place] with husband. 2 girls in college.      Work - Surveyor, mining at [employer]   Diet - healthy   Exercise - walks 3-4 miles per day   Social Determinants of Health   Financial Resource Strain: Not on file  Food Insecurity: Not on file  Transportation Needs: Not on file  Physical Activity: Not on file  Stress: Not on file  Social Connections: Not on file  Intimate Partner Violence: Not on file    Review of Systems: See HPI, otherwise negative ROS  Physical Exam: BP 132/89   Pulse 81   Temp (!) 96.9 F (36.1 C) (Temporal)   Resp 16   Ht 5\' 7"  (1.702 m)   Wt 61.2 kg   SpO2 100%   BMI 21.14 kg/m  General:   Alert,  pleasant and cooperative in NAD Head:  Normocephalic and atraumatic. Neck:   Supple; no masses or thyromegaly. Lungs:  Clear throughout to auscultation.    Heart:  Regular rate and rhythm. Abdomen:  Soft, nontender and nondistended. Normal bowel sounds, without guarding, and without rebound.   Neurologic:  Alert and  oriented x4;  grossly normal neurologically.  Impression/Plan: Natasha Pace is here for an colonoscopy to be performed for colon cancer screening  Risks, benefits, limitations, and alternatives regarding  colonoscopy have been reviewed with the patient.  Questions have been answered.  All parties agreeable.   Trixie Rude, MD  11/09/2020, 8:48 AM

## 2020-11-09 NOTE — Anesthesia Postprocedure Evaluation (Signed)
Anesthesia Post Note  Patient: Natasha Pace  Procedure(s) Performed: COLONOSCOPY WITH PROPOFOL  Patient location during evaluation: Endoscopy Anesthesia Type: General Level of consciousness: awake and alert Pain management: pain level controlled Vital Signs Assessment: post-procedure vital signs reviewed and stable Respiratory status: spontaneous breathing, nonlabored ventilation, respiratory function stable and patient connected to nasal cannula oxygen Cardiovascular status: blood pressure returned to baseline and stable Postop Assessment: no apparent nausea or vomiting Anesthetic complications: no   No notable events documented.   Last Vitals:  Vitals:   11/09/20 1002 11/09/20 1012  BP: 127/74 (!) 151/90  Pulse: 60 (!) 55  Resp: 13 14  Temp:    SpO2: 100% 100%    Last Pain:  Vitals:   11/09/20 1012  TempSrc:   PainSc: 0-No pain                 Cleda Mccreedy Kass Herberger

## 2020-11-09 NOTE — Anesthesia Preprocedure Evaluation (Signed)
Anesthesia Evaluation  Patient identified by MRN, date of birth, ID band Patient awake    Reviewed: Allergy & Precautions, NPO status , Patient's Chart, lab work & pertinent test results  Airway Mallampati: III  TM Distance: >3 FB Neck ROM: full    Dental  (+) Chipped   Pulmonary neg pulmonary ROS, neg shortness of breath,    Pulmonary exam normal        Cardiovascular Exercise Tolerance: Good hypertension, (-) angina(-) Past MI Normal cardiovascular exam     Neuro/Psych negative neurological ROS  negative psych ROS   GI/Hepatic Neg liver ROS, GERD  Medicated and Controlled,  Endo/Other  negative endocrine ROS  Renal/GU negative Renal ROS  negative genitourinary   Musculoskeletal   Abdominal   Peds  Hematology negative hematology ROS (+)   Anesthesia Other Findings Past Medical History: No date: Allergy     Comment:  hay fever No date: Chicken pox No date: GERD (gastroesophageal reflux disease) No date: Hyperlipidemia No date: Hypertension 2014: Vaginal polyp     Comment:  benign  Past Surgical History: No date: INTRAUTERINE DEVICE INSERTION     Comment:  Removed 2014, Mirena, [facility], [facility] No date: VAGINAL DELIVERY     Comment:  2  BMI    Body Mass Index: 21.14 kg/m      Reproductive/Obstetrics negative OB ROS                             Anesthesia Physical Anesthesia Plan  ASA: 2  Anesthesia Plan: General   Post-op Pain Management:    Induction: Intravenous  PONV Risk Score and Plan: Propofol infusion and TIVA  Airway Management Planned: Natural Airway and Nasal Cannula  Additional Equipment:   Intra-op Plan:   Post-operative Plan:   Informed Consent: I have reviewed the patients History and Physical, chart, labs and discussed the procedure including the risks, benefits and alternatives for the proposed anesthesia with the patient or  authorized representative who has indicated his/her understanding and acceptance.     Dental Advisory Given  Plan Discussed with: Anesthesiologist, CRNA and Surgeon  Anesthesia Plan Comments: (Patient consented for risks of anesthesia including but not limited to:  - adverse reactions to medications - risk of airway placement if required - damage to eyes, teeth, lips or other oral mucosa - nerve damage due to positioning  - sore throat or hoarseness - Damage to heart, brain, nerves, lungs, other parts of body or loss of life  Patient voiced understanding.)        Anesthesia Quick Evaluation

## 2020-11-09 NOTE — Anesthesia Procedure Notes (Signed)
Procedure Name: General with mask airway Date/Time: 11/09/2020 9:46 AM Performed by: Mohammed Kindle, CRNA Pre-anesthesia Checklist: Patient identified, Emergency Drugs available, Suction available and Patient being monitored Patient Re-evaluated:Patient Re-evaluated prior to induction Oxygen Delivery Method: Simple face mask Induction Type: IV induction Placement Confirmation: positive ETCO2 and CO2 detector Dental Injury: Teeth and Oropharynx as per pre-operative assessment

## 2020-11-09 NOTE — Op Note (Signed)
[facility] Gastroenterology Patient Name: Natasha Pace Procedure Date: 11/09/2020 9:16 AM MRN: [id number] Account #: [id number] Date of Birth: [birth year] Admit Type: Outpatient Age: 65 Room: [facility] ENDO ROOM 4 Gender: Female Note Status: Finalized Procedure:             Colonoscopy Indications:           Screening for colorectal malignant neoplasm, Last                         colonoscopy: January 2011, Last colonoscopy 10 years                         ago Providers:             Toney Reil MD, MD Referring MD:          Lyn Records. Arnett (Referring MD) Medicines:             General Anesthesia Complications:         No immediate complications. Estimated blood loss: None. Procedure:             Pre-Anesthesia Assessment:                        - Prior to the procedure, a History and Physical was                         performed, and patient medications and allergies were                         reviewed. The patient is competent. The risks and                         benefits of the procedure and the sedation options and                         risks were discussed with the patient. All questions                         were answered and informed consent was obtained.                         Patient identification and proposed procedure were                         verified by the physician, the nurse, the                         anesthesiologist, the anesthetist and the technician                         in the pre-procedure area in the procedure room in the                         endoscopy suite. Mental Status Examination: alert and                         oriented. Airway Examination: normal oropharyngeal  airway and neck mobility. Respiratory Examination:                         clear to auscultation. CV Examination: normal.                         Prophylactic Antibiotics: The patient does not require                          prophylactic antibiotics. Prior Anticoagulants: The                         patient has taken no previous anticoagulant or                         antiplatelet agents. ASA Grade Assessment: II - A                         patient with mild systemic disease. After reviewing                         the risks and benefits, the patient was deemed in                         satisfactory condition to undergo the procedure. The                         anesthesia plan was to use general anesthesia.                         Immediately prior to administration of medications,                         the patient was re-assessed for adequacy to receive                         sedatives. The heart rate, respiratory rate, oxygen                         saturations, blood pressure, adequacy of pulmonary                         ventilation, and response to care were monitored                         throughout the procedure. The physical status of the                         patient was re-assessed after the procedure.                        After obtaining informed consent, the colonoscope was                         passed under direct vision. Throughout the procedure,                         the patient's blood pressure, pulse, and oxygen  saturations were monitored continuously. The                         Colonoscope was introduced through the anus and                         advanced to the the terminal ileum, with                         identification of the appendiceal orifice and IC                         valve. The colonoscopy was performed without                         difficulty. The patient tolerated the procedure well.                         The quality of the bowel preparation was evaluated                         using the BBPS [facility] Bowel Preparation Scale) with                         scores of: Right Colon = 3, Transverse Colon = 3 and                          Left Colon = 3 (entire mucosa seen well with no                         residual staining, small fragments of stool or opaque                         liquid). The total BBPS score equals 9. Findings:      The perianal and digital rectal examinations were normal. Pertinent       negatives include normal sphincter tone and no palpable rectal lesions.      The terminal ileum appeared normal.      Multiple diverticula were found in the sigmoid colon.      Non-bleeding external hemorrhoids were found during retroflexion. The       hemorrhoids were medium-sized.      The entire examined colon appeared normal. Impression:            - The examined portion of the ileum was normal.                        - Diverticulosis in the sigmoid colon.                        - Non-bleeding external hemorrhoids.                        - The entire examined colon is normal.                        - No specimens collected. Recommendation:        - Discharge patient to home (with escort).                        -  Resume previous diet today.                        - Continue present medications.                        - Repeat colonoscopy in 10 years for screening                         purposes. Procedure Code(s):     --- Professional ---                        Q4696, Colorectal cancer screening; colonoscopy on                         individual not meeting criteria for high risk Diagnosis Code(s):     --- Professional ---                        Z12.11, Encounter for screening for malignant neoplasm                         of colon                        K64.4, Residual hemorrhoidal skin tags                        K57.30, Diverticulosis of large intestine without                         perforation or abscess without bleeding CPT copyright 2019 American Medical Association. All rights reserved. The codes documented in this report are preliminary and upon coder review may  be revised to meet current  compliance requirements. Dr. Libby Maw Toney Reil MD, MD 11/09/2020 9:42:09 AM This report has been signed electronically. Number of Addenda: 0 Note Initiated On: 11/09/2020 9:16 AM Scope Withdrawal Time: 0 hours 7 minutes 50 seconds  Total Procedure Duration: 0 hours 11 minutes 49 seconds  Estimated Blood Loss:  Estimated blood loss: none.      [facility]

## 2020-11-09 NOTE — Transfer of Care (Signed)
Immediate Anesthesia Transfer of Care Note  Patient: Natasha Pace  Procedure(s) Performed: COLONOSCOPY WITH PROPOFOL  Patient Location: Endoscopy Unit  Anesthesia Type:General  Level of Consciousness: drowsy and patient cooperative  Airway & Oxygen Therapy: Patient Spontanous Breathing and Patient connected to face mask oxygen  Post-op Assessment: Report given to RN and Post -op Vital signs reviewed and stable  Post vital signs: Reviewed and stable  Last Vitals:  Vitals Value Taken Time  BP 108/62 11/09/20 0942  Temp    Pulse 62 11/09/20 0947  Resp 26 11/09/20 0947  SpO2 100 % 11/09/20 0947  Vitals shown include unvalidated device data.  Last Pain:  Vitals:   11/09/20 0942  TempSrc:   PainSc: 0-No pain         Complications: No notable events documented.

## 2020-11-10 ENCOUNTER — Encounter: Payer: Self-pay | Admitting: Gastroenterology

## 2020-11-10 ENCOUNTER — Other Ambulatory Visit: Payer: Self-pay | Admitting: Family

## 2020-11-10 DIAGNOSIS — I10 Essential (primary) hypertension: Secondary | ICD-10-CM

## 2020-11-10 DIAGNOSIS — N952 Postmenopausal atrophic vaginitis: Secondary | ICD-10-CM

## 2020-12-21 ENCOUNTER — Other Ambulatory Visit: Payer: Self-pay | Admitting: Family

## 2020-12-21 DIAGNOSIS — I10 Essential (primary) hypertension: Secondary | ICD-10-CM

## 2021-01-02 ENCOUNTER — Encounter: Payer: Self-pay | Admitting: Family

## 2021-01-02 ENCOUNTER — Other Ambulatory Visit: Payer: Self-pay

## 2021-01-02 DIAGNOSIS — J309 Allergic rhinitis, unspecified: Secondary | ICD-10-CM

## 2021-01-02 MED ORDER — MOMETASONE FUROATE 50 MCG/ACT NA SUSP: 2.0000 | Freq: Every day | NASAL | 1 refills | Status: DC

## 2021-02-16 ENCOUNTER — Other Ambulatory Visit: Payer: Self-pay | Admitting: Addiction Medicine

## 2021-02-16 ENCOUNTER — Other Ambulatory Visit: Payer: Self-pay | Admitting: Physician Assistant

## 2021-02-16 ENCOUNTER — Other Ambulatory Visit: Payer: Self-pay | Admitting: Family

## 2021-02-16 DIAGNOSIS — Z1231 Encounter for screening mammogram for malignant neoplasm of breast: Secondary | ICD-10-CM

## 2021-02-28 ENCOUNTER — Other Ambulatory Visit: Payer: Self-pay | Admitting: Family

## 2021-02-28 DIAGNOSIS — J309 Allergic rhinitis, unspecified: Secondary | ICD-10-CM

## 2021-03-14 ENCOUNTER — Ambulatory Visit
Admission: RE | Admit: 2021-03-14 | Discharge: 2021-03-14 | Disposition: A | Discharge status: Home / Self Care | Payer: BC Managed Care – PPO | Source: Ambulatory Visit | Attending: Family | Admitting: Family

## 2021-03-14 ENCOUNTER — Other Ambulatory Visit: Payer: Self-pay

## 2021-03-14 DIAGNOSIS — Z1231 Encounter for screening mammogram for malignant neoplasm of breast: Secondary | ICD-10-CM | POA: Insufficient documentation

## 2021-03-18 ENCOUNTER — Other Ambulatory Visit: Payer: Self-pay | Admitting: Family

## 2021-07-24 ENCOUNTER — Encounter: Payer: Self-pay | Admitting: Family

## 2021-07-31 ENCOUNTER — Encounter: Payer: Self-pay | Admitting: Family

## 2021-08-01 ENCOUNTER — Encounter: Payer: BC Managed Care – PPO | Admitting: Family

## 2021-08-01 ENCOUNTER — Other Ambulatory Visit: Payer: Self-pay

## 2021-08-01 DIAGNOSIS — I10 Essential (primary) hypertension: Secondary | ICD-10-CM

## 2021-08-01 MED ORDER — METOPROLOL SUCCINATE ER 50 MG PO TB24: ORAL_TABLET | ORAL | 3 refills | Status: DC

## 2021-08-14 ENCOUNTER — Encounter: Payer: Self-pay | Admitting: Family

## 2021-08-14 ENCOUNTER — Ambulatory Visit (INDEPENDENT_AMBULATORY_CARE_PROVIDER_SITE_OTHER): Payer: BC Managed Care – PPO | Admitting: Family

## 2021-08-14 VITALS — BP 104/62 | HR 63 | Temp 98.0°F | Ht 67.0 in | Wt 137.8 lb

## 2021-08-14 DIAGNOSIS — Z0000 Encounter for general adult medical examination without abnormal findings: Secondary | ICD-10-CM

## 2021-08-14 DIAGNOSIS — J309 Allergic rhinitis, unspecified: Secondary | ICD-10-CM | POA: Diagnosis not present

## 2021-08-14 DIAGNOSIS — Z1231 Encounter for screening mammogram for malignant neoplasm of breast: Secondary | ICD-10-CM | POA: Diagnosis not present

## 2021-08-14 DIAGNOSIS — K219 Gastro-esophageal reflux disease without esophagitis: Secondary | ICD-10-CM | POA: Diagnosis not present

## 2021-08-14 LAB — LIPID PANEL
Cholesterol: 167 mg/dL (ref 0–200)
HDL: 60.7 mg/dL (ref >39.00)
LDL Cholesterol: 80 mg/dL (ref 0–99)
NonHDL: 106.16
Total CHOL/HDL Ratio: 3
Triglycerides: 130 mg/dL (ref 0.0–149.0)
VLDL: 26 mg/dL (ref 0.0–40.0)

## 2021-08-14 LAB — COMPREHENSIVE METABOLIC PANEL
ALT: 20 U/L (ref 0–35)
AST: 23 U/L (ref 0–37)
Albumin: 4.4 g/dL (ref 3.5–5.2)
Alkaline Phosphatase: 65 U/L (ref 39–117)
BUN: 15 mg/dL (ref 6–23)
CO2: 27 mEq/L (ref 19–32)
Calcium: 9.6 mg/dL (ref 8.4–10.5)
Chloride: 102 mEq/L (ref 96–112)
Creatinine, Ser: 0.81 mg/dL (ref 0.40–1.20)
GFR: 77.12 mL/min (ref >60.00)
Glucose, Bld: 79 mg/dL (ref 70–99)
Potassium: 4 mEq/L (ref 3.5–5.1)
Sodium: 139 mEq/L (ref 135–145)
Total Bilirubin: 1 mg/dL (ref 0.2–1.2)
Total Protein: 7.4 g/dL (ref 6.0–8.3)

## 2021-08-14 LAB — CBC WITH DIFFERENTIAL/PLATELET
Basophils Absolute: 0 10*3/uL (ref 0.0–0.1)
Basophils Relative: 0.7 % (ref 0.0–3.0)
Eosinophils Absolute: 0.2 10*3/uL (ref 0.0–0.7)
Eosinophils Relative: 3.9 % (ref 0.0–5.0)
HCT: 38.9 % (ref 36.0–46.0)
Hemoglobin: 13.3 g/dL (ref 12.0–15.0)
Lymphocytes Relative: 44.5 % (ref 12.0–46.0)
Lymphs Abs: 2.8 10*3/uL (ref 0.7–4.0)
MCHC: 34.3 g/dL (ref 30.0–36.0)
MCV: 89.2 fl (ref 78.0–100.0)
Monocytes Absolute: 0.5 10*3/uL (ref 0.1–1.0)
Monocytes Relative: 7.3 % (ref 3.0–12.0)
Neutro Abs: 2.7 10*3/uL (ref 1.4–7.7)
Neutrophils Relative %: 43.6 % (ref 43.0–77.0)
Platelets: 211 10*3/uL (ref 150.0–400.0)
RBC: 4.36 Mil/uL (ref 3.87–5.11)
RDW: 13.6 % (ref 11.5–15.5)
WBC: 6.3 10*3/uL (ref 4.0–10.5)

## 2021-08-14 LAB — HEMOGLOBIN A1C: Hgb A1c MFr Bld: 6 % (ref 4.6–6.5)

## 2021-08-14 LAB — VITAMIN D 25 HYDROXY (VIT D DEFICIENCY, FRACTURES): VITD: 34.79 ng/mL (ref 30.00–100.00)

## 2021-08-14 LAB — TSH: TSH: 1.89 u[IU]/mL (ref 0.35–5.50)

## 2021-08-14 MED ORDER — AZELASTINE HCL 0.1 % NA SOLN: 1.0000 | Freq: Two times a day (BID) | NASAL | 4 refills | Status: DC

## 2021-08-14 NOTE — Progress Notes (Signed)
? ?Subjective:  ? ? Patient ID: RIFKY LAPRE, female    DOB: 1956/02/05, 66 y.o.   MRN: [id number] ? ?CC: RAFFAELLA EDISON is a 66 y.o. female who presents today for physical exam.   ? ?HPI: Feels well today ?No new complaints ? ? ?Breakthrough symptoms with allergies, nasal congestion. Compliant with zyxal and nasonex ? ?GERD- compliant with pepcid 20mg  BID with symptom control. No trouble or pain with swallowing.  ? ?She is not following with dermatology any more.  ? ?Colorectal Cancer Screening: UTD, repeat in 10 years  ?Breast Cancer Screening: Mammogram UTD ?Cervical Cancer Screening: ASCUS , negative HPV 07/26/20; Dr 09/25/20, GYN,  advised repeat in 3 years time. No pelvic pain, vaginal bleeding.  ?She has no h/o HPV, CIN. No h/o GYN cancer.  ?Bone Health screening/DEXA for 65+: No increased fracture risk. Defer screening at this time. ? ?Lung Cancer Screening: Doesn't have 20 year pack year history and age > 74 years yo 49 years ? ? ?      Tetanus - UTD ?       ?Exercise: Gets regular exercise.   ?Alcohol use:  occassionally ?Smoking/tobacco use: Nonsmoker.   ? ? ?HISTORY:  ?Past Medical History:  ?Diagnosis Date  ? Allergy   ? hay fever  ? Chicken pox   ? GERD (gastroesophageal reflux disease)   ? Hyperlipidemia   ? Hypertension   ? Vaginal polyp 2014  ? benign  ?  ?Past Surgical History:  ?Procedure Laterality Date  ? COLONOSCOPY WITH PROPOFOL N/A 11/09/2020  ? Procedure: COLONOSCOPY WITH PROPOFOL;  Surgeon: 11/11/2020, MD;  Location: [facility] ENDOSCOPY;  Service: Gastroenterology;  Laterality: N/A;  ? INTRAUTERINE DEVICE INSERTION    ? Removed 2014, [place], [facility], [place]  ? VAGINAL DELIVERY    ? 2  ? ?Family History  ?Problem Relation Age of Onset  ? Hypertension Mother   ? Diabetes Maternal Grandmother   ? Cancer Maternal Grandmother   ?     breast  ? Breast cancer Maternal Grandmother 4  ? Hypertension Brother   ? Hypertension Father   ? Diabetes Maternal Grandfather   ? Ovarian cancer Other  74  ?     ovarian/uterus, Aunt,   ? ?  ? ?ALLERGIES: Patient has no known allergies. ? ?Current Outpatient Medications on File Prior to Visit  ?Medication Sig Dispense Refill  ? amLODipine (NORVASC) 2.5 MG tablet TAKE 1 TABLET BY MOUTH EVERY DAY 90 tablet 3  ? atorvastatin (LIPITOR) 20 MG tablet TAKE 1 TABLET BY MOUTH EVERY DAY 90 tablet 2  ? Cholecalciferol (VITAMIN D3) 1000 units CAPS Take by mouth daily.    ? famotidine (PEPCID) 20 MG tablet Take 20 mg by mouth 2 (two) times daily.    ? levocetirizine (XYZAL) 5 MG tablet Take 5 mg by mouth every evening.    ? metoprolol succinate (TOPROL-XL) 50 MG 24 hr tablet Take with or immediately following a meal. 90 tablet 3  ? mometasone (NASONEX) 50 MCG/ACT nasal spray PLACE 2 SPRAYS INTO THE NOSE DAILY. 17 each 1  ? Multiple Vitamins-Minerals (WOMENS MULTIVITAMIN) TABS Take by mouth daily.    ? YUVAFEM 10 MCG TABS vaginal tablet PLACE 1 TABLET (10 MCG TOTAL) VAGINALLY ONCE A WEEK. 16 tablet 1  ? ?No current facility-administered medications on file prior to visit.  ? ? ?Social History  ? ?Tobacco Use  ? Smoking status: Never  ? Smokeless tobacco: Never  ?Vaping Use  ?  Vaping Use: Never used  ?Substance Use Topics  ? Alcohol use: Yes  ?  Comment: 3-4 days per week, one glass of wine.   ? Drug use: Never  ? ? ?Review of Systems  ?Constitutional:  Negative for chills, fever and unexpected weight change.  ?HENT:  Negative for congestion.   ?Respiratory:  Negative for cough.   ?Cardiovascular:  Negative for chest pain, palpitations and leg swelling.  ?Gastrointestinal:  Negative for nausea and vomiting.  ?Musculoskeletal:  Negative for arthralgias and myalgias.  ?Skin:  Negative for rash.  ?Neurological:  Negative for headaches.  ?Hematological:  Negative for adenopathy.  ?Psychiatric/Behavioral:  Negative for confusion.   ?   ?Objective:  ?  ?BP 104/62 (BP Location: Left Arm, Patient Position: Sitting, Cuff Size: Normal)   Pulse 63   Temp 98 ?F (36.7 ?C) (Oral)   Ht 5\' 7"   (1.702 m)   Wt 137 lb 12.8 oz (62.5 kg)   SpO2 99%   BMI 21.58 kg/m?  ? ?BP Readings from Last 3 Encounters:  ?08/14/21 104/62  ?11/09/20 (!) 151/90  ?07/26/20 108/70  ? ?Wt Readings from Last 3 Encounters:  ?08/14/21 137 lb 12.8 oz (62.5 kg)  ?11/09/20 135 lb (61.2 kg)  ?07/26/20 138 lb (62.6 kg)  ? ? ?Physical Exam ?Vitals reviewed.  ?Constitutional:   ?   Appearance: Normal appearance. She is well-developed.  ?HENT:  ?   Head: Normocephalic and atraumatic.  ?   Right Ear: Hearing, tympanic membrane, ear canal and external ear normal. No decreased hearing noted. No drainage, swelling or tenderness. No middle ear effusion. No foreign body. Tympanic membrane is not erythematous or bulging.  ?   Left Ear: Hearing, tympanic membrane, ear canal and external ear normal. No decreased hearing noted. No drainage, swelling or tenderness.  No middle ear effusion. No foreign body. Tympanic membrane is not erythematous or bulging.  ?   Nose: Nose normal. No rhinorrhea.  ?   Right Sinus: No maxillary sinus tenderness or frontal sinus tenderness.  ?   Left Sinus: No maxillary sinus tenderness or frontal sinus tenderness.  ?   Mouth/Throat:  ?   Pharynx: Uvula midline. No oropharyngeal exudate or posterior oropharyngeal erythema.  ?   Tonsils: No tonsillar abscesses.  ?Eyes:  ?   Conjunctiva/sclera: Conjunctivae normal.  ?Neck:  ?   Thyroid: No thyroid mass or thyromegaly.  ?Cardiovascular:  ?   Rate and Rhythm: Normal rate and regular rhythm.  ?   Pulses: Normal pulses.  ?   Heart sounds: Normal heart sounds.  ?Pulmonary:  ?   Effort: Pulmonary effort is normal.  ?   Breath sounds: Normal breath sounds. No wheezing, rhonchi or rales.  ?Chest:  ?Breasts: ?   Breasts are symmetrical.  ?   Right: No inverted nipple, mass, nipple discharge, skin change or tenderness.  ?   Left: No inverted nipple, mass, nipple discharge, skin change or tenderness.  ?Abdominal:  ?   General: Bowel sounds are normal. There is no distension.  ?    Palpations: Abdomen is soft. Abdomen is not rigid. There is no fluid wave or mass.  ?   Tenderness: There is no abdominal tenderness. There is no guarding or rebound.  ?Lymphadenopathy:  ?   Head:  ?   Right side of head: No submental, submandibular, tonsillar, preauricular, posterior auricular or occipital adenopathy.  ?   Left side of head: No submental, submandibular, tonsillar, preauricular, posterior auricular or occipital adenopathy.  ?  Cervical: No cervical adenopathy.  ?   Right cervical: No superficial, deep or posterior cervical adenopathy. ?   Left cervical: No superficial, deep or posterior cervical adenopathy.  ?Skin: ?   General: Skin is warm and dry.  ?Neurological:  ?   Mental Status: She is alert.  ?Psychiatric:     ?   Speech: Speech normal.     ?   Behavior: Behavior normal.     ?   Thought Content: Thought content normal.  ? ? ?   ?Assessment & Plan:  ? ?Problem List Items Addressed This Visit   ? ?  ? Respiratory  ? Allergic rhinitis  ?  Suboptimal control. Trial azelastine. Stop nasonex.  ?Continue Xyzal.  ?She will let me know how she is doing ? ?  ?  ? Relevant Medications  ? azelastine (ASTELIN) 0.1 % nasal spray  ?  ? Digestive  ? GERD (gastroesophageal reflux disease)  ?  Chronic, stable. No alarm features at this time. Continue pepcid 20mg  bid ? ?  ?  ?  ? Other  ? Routine general medical examination at a health care facility - Primary  ?  Encouraged continued exercise.  ?Pap is up to date. Per guidelines, HPV-negative with ASC-US and an unknown history (risk of CIN 3+ at five years 0.4 percent).Five-year risk 0.15 to 0.54 percent: Testing in three years.  ?  In the absence if pelvic concerns, we also deferred pelvic exam.  ?She will make a follow up with Dr Adolphus Birchwoodasher.  ? ?  ?  ? Relevant Orders  ? VITAMIN D 25 Hydroxy (Vit-D Deficiency, Fractures)  ? Hemoglobin A1c  ? TSH  ? CBC with Differential/Platelet  ? Comprehensive metabolic panel  ? Lipid panel  ? ?Other Visit Diagnoses   ? ?  Encounter for screening mammogram for malignant neoplasm of breast      ? Relevant Orders  ? MM 3D SCREEN BREAST BILATERAL  ? ?  ? ? ? ?I have discontinued Birdie HopesSusan S. Gathers's fluticasone and mupirocin ointment. I

## 2021-08-14 NOTE — Assessment & Plan Note (Signed)
Suboptimal control. Trial azelastine. Stop nasonex.  ?Continue Xyzal.  ?She will let me know how she is doing ?

## 2021-08-14 NOTE — Assessment & Plan Note (Addendum)
Encouraged continued exercise.  ?Pap is up to date. Per guidelines, HPV-negative with ASC-US and an unknown history (risk of CIN 3+ at five years 0.4 percent).Five-year risk 0.15 to 0.54 percent: Testing in three years.  ?  In the absence if pelvic concerns, we also deferred pelvic exam.  ?She will make a follow up with Dr Adolphus Birchwood.  ?

## 2021-08-14 NOTE — Patient Instructions (Signed)
Please re establish with dr dasher.  ? ?Nice to see you! ? ?Health Maintenance for Postmenopausal Women ?Menopause is a normal process in which your ability to get pregnant comes to an end. This process happens slowly over many months or years, usually between the ages of 38 and 24. Menopause is complete when you have missed your menstrual period for 12 months. ?It is important to talk with your health care provider about some of the most common conditions that affect women after menopause (postmenopausal women). These include heart disease, cancer, and bone loss (osteoporosis). Adopting a healthy lifestyle and getting preventive care can help to promote your health and wellness. The actions you take can also lower your chances of developing some of these common conditions. ?What are the signs and symptoms of menopause? ?During menopause, you may have the following symptoms: ?Hot flashes. These can be moderate or severe. ?Night sweats. ?Decrease in sex drive. ?Mood swings. ?Headaches. ?Tiredness (fatigue). ?Irritability. ?Memory problems. ?Problems falling asleep or staying asleep. ?Talk with your health care provider about treatment options for your symptoms. ?Do I need hormone replacement therapy? ?Hormone replacement therapy is effective in treating symptoms that are caused by menopause, such as hot flashes and night sweats. ?Hormone replacement carries certain risks, especially as you become older. If you are thinking about using estrogen or estrogen with progestin, discuss the benefits and risks with your health care provider. ?How can I reduce my risk for heart disease and stroke? ?The risk of heart disease, heart attack, and stroke increases as you age. One of the causes may be a change in the body's hormones during menopause. This can affect how your body uses dietary fats, triglycerides, and cholesterol. Heart attack and stroke are medical emergencies. There are many things that you can do to help prevent  heart disease and stroke. ?Watch your blood pressure ?High blood pressure causes heart disease and increases the risk of stroke. This is more likely to develop in people who have high blood pressure readings or are overweight. ?Have your blood pressure checked: ?Every 3-5 years if you are 53-51 years of age. ?Every year if you are 71 years old or older. ?Eat a healthy diet ? ?Eat a diet that includes plenty of vegetables, fruits, low-fat dairy products, and lean protein. ?Do not eat a lot of foods that are high in solid fats, added sugars, or sodium. ?Get regular exercise ?Get regular exercise. This is one of the most important things you can do for your health. Most adults should: ?Try to exercise for at least 150 minutes each week. The exercise should increase your heart rate and make you sweat (moderate-intensity exercise). ?Try to do strengthening exercises at least twice each week. Do these in addition to the moderate-intensity exercise. ?Spend less time sitting. Even light physical activity can be beneficial. ?Other tips ?Work with your health care provider to achieve or maintain a healthy weight. ?Do not use any products that contain nicotine or tobacco. These products include cigarettes, chewing tobacco, and vaping devices, such as e-cigarettes. If you need help quitting, ask your health care provider. ?Know your numbers. Ask your health care provider to check your cholesterol and your blood sugar (glucose). Continue to have your blood tested as directed by your health care provider. ?Do I need screening for cancer? ?Depending on your health history and family history, you may need to have cancer screenings at different stages of your life. This may include screening for: ?Breast cancer. ?Cervical cancer. ?Lung cancer. ?  Colorectal cancer. ?What is my risk for osteoporosis? ?After menopause, you may be at increased risk for osteoporosis. Osteoporosis is a condition in which bone destruction happens more  quickly than new bone creation. To help prevent osteoporosis or the bone fractures that can happen because of osteoporosis, you may take the following actions: ?If you are 21-56 years old, get at least 1,000 mg of calcium and at least 600 international units (IU) of vitamin D per day. ?If you are older than age 4 but younger than age 78, get at least 1,200 mg of calcium and at least 600 international units (IU) of vitamin D per day. ?If you are older than age 35, get at least 1,200 mg of calcium and at least 800 international units (IU) of vitamin D per day. ?Smoking and drinking excessive alcohol increase the risk of osteoporosis. Eat foods that are rich in calcium and vitamin D, and do weight-bearing exercises several times each week as directed by your health care provider. ?How does menopause affect my mental health? ?Depression may occur at any age, but it is more common as you become older. Common symptoms of depression include: ?Feeling depressed. ?Changes in sleep patterns. ?Changes in appetite or eating patterns. ?Feeling an overall lack of motivation or enjoyment of activities that you previously enjoyed. ?Frequent crying spells. ?Talk with your health care provider if you think that you are experiencing any of these symptoms. ?General instructions ?See your health care provider for regular wellness exams and vaccines. This may include: ?Scheduling regular health, dental, and eye exams. ?Getting and maintaining your vaccines. These include: ?Influenza vaccine. Get this vaccine each year before the flu season begins. ?Pneumonia vaccine. ?Shingles vaccine. ?Tetanus, diphtheria, and pertussis (Tdap) booster vaccine. ?Your health care provider may also recommend other immunizations. ?Tell your health care provider if you have ever been abused or do not feel safe at home. ?Summary ?Menopause is a normal process in which your ability to get pregnant comes to an end. ?This condition causes hot flashes, night  sweats, decreased interest in sex, mood swings, headaches, or lack of sleep. ?Treatment for this condition may include hormone replacement therapy. ?Take actions to keep yourself healthy, including exercising regularly, eating a healthy diet, watching your weight, and checking your blood pressure and blood sugar levels. ?Get screened for cancer and depression. Make sure that you are up to date with all your vaccines. ?This information is not intended to replace advice given to you by your health care provider. Make sure you discuss any questions you have with your health care provider. ?Document Revised: 08/29/2020 Document Reviewed: 08/29/2020 ?Elsevier Patient Education ? 2023 [employer]. ? ?

## 2021-08-14 NOTE — Assessment & Plan Note (Signed)
Chronic, stable. No alarm features at this time. Continue pepcid 20mg  bid ?

## 2021-08-16 ENCOUNTER — Encounter: Payer: Self-pay | Admitting: Family

## 2021-08-16 ENCOUNTER — Other Ambulatory Visit: Payer: Self-pay

## 2021-08-16 DIAGNOSIS — N952 Postmenopausal atrophic vaginitis: Secondary | ICD-10-CM

## 2021-08-16 MED ORDER — ESTRADIOL 10 MCG VA TABS: ORAL_TABLET | VAGINAL | 1 refills | Status: DC

## 2021-11-08 ENCOUNTER — Encounter: Payer: Self-pay | Admitting: Family

## 2021-11-09 NOTE — Telephone Encounter (Signed)
Called and spoke to patient about her possible pulled muscle and was able to schedule her with Dr Birdie Sons on 11/13/21

## 2021-11-13 ENCOUNTER — Ambulatory Visit: Payer: BC Managed Care – PPO | Admitting: Family Medicine

## 2021-11-13 ENCOUNTER — Other Ambulatory Visit: Payer: Self-pay | Admitting: Family

## 2021-11-13 DIAGNOSIS — J309 Allergic rhinitis, unspecified: Secondary | ICD-10-CM

## 2021-11-13 NOTE — Progress Notes (Deleted)
Checking to see if this works

## 2021-12-10 ENCOUNTER — Other Ambulatory Visit: Payer: Self-pay | Admitting: Family

## 2021-12-10 DIAGNOSIS — I10 Essential (primary) hypertension: Secondary | ICD-10-CM

## 2022-02-10 ENCOUNTER — Other Ambulatory Visit: Payer: Self-pay | Admitting: Family

## 2022-02-10 DIAGNOSIS — N952 Postmenopausal atrophic vaginitis: Secondary | ICD-10-CM

## 2022-04-10 ENCOUNTER — Encounter: Payer: Self-pay | Admitting: Family

## 2022-04-10 ENCOUNTER — Ambulatory Visit: Payer: BC Managed Care – PPO | Admitting: Family

## 2022-04-10 VITALS — BP 124/78 | HR 108 | Temp 98.7°F | Ht 68.0 in | Wt 140.6 lb

## 2022-04-10 DIAGNOSIS — J309 Allergic rhinitis, unspecified: Secondary | ICD-10-CM | POA: Diagnosis not present

## 2022-04-10 DIAGNOSIS — I10 Essential (primary) hypertension: Secondary | ICD-10-CM

## 2022-04-10 NOTE — Assessment & Plan Note (Signed)
Chronic, suboptimal control.  Discussed trial of Singulair, patient may consider this in the future.  Referral to ENT.  Patient politely declines CT sinus at this time.

## 2022-04-10 NOTE — Assessment & Plan Note (Signed)
Blood pressure has been labile of late.  Advise she may take an additional dose of amlodipine 2.5 mg for blood pressure greater than 140/80.  Continue metoprolol 50 mg daily.  She will continue monitoring blood pressure at  home

## 2022-04-10 NOTE — Patient Instructions (Addendum)
I would consider singulair and /or CT sinus  Referral to Dr Jenne Campus  Take additional amlodipine 2.5mg  if blood pressure 140/80.   Please bring blood pressure cuff with you to your next appointment.

## 2022-04-10 NOTE — Progress Notes (Signed)
Assessment & Plan:  Allergic rhinitis, unspecified seasonality, unspecified trigger Assessment & Plan: Chronic, suboptimal control.  Discussed trial of Singulair, patient may consider this in the future.  Referral to ENT.  Patient politely declines CT sinus at this time.  Orders: -     Ambulatory referral to ENT  Hypertension, unspecified type Assessment & Plan: Blood pressure has been labile of late.  Advise she may take an additional dose of amlodipine 2.5 mg for blood pressure greater than 140/80.  Continue metoprolol 50 mg daily.  She will continue monitoring blood pressure at  home      Return precautions given.   Risks, benefits, and alternatives of the medications and treatment plan prescribed today were discussed, and patient expressed understanding.   Education regarding symptom management and diagnosis given to patient on AVS either electronically or printed.  Return for Complete Physical Exam.  Rennie Plowman, FNP  Subjective:    Patient ID: Natasha Pace, female    DOB: May 27, 1955, 66 y.o.   MRN: [id number]  CC: Natasha Pace is a 66 y.o. female who presents today for follow up.   HPI: Blood pressure has been elevated over 6 weeks.   BP at home 146/89.   She can feel when BP is elevated. Occasional HA and not sure if sinus related.   No cp, left arm numbness  No salt indiscretion. No decongestants.   Sleeping well.  Exercises regularly.   Would like shingrex vaccine. She has zostavax 2015.      HTN-compliant with amlodipine 2.5 mg daily, metoprolol 50 mg qd  She has chronic sinusitis.  Previously seen [facility] recommend to have some surgery years ago.  No fever.  Chronic left-sided facial pain.  Compliant with azelastine, Xyzal.  No nsaids, decongestantst Allergies: Patient has no known allergies. Current Outpatient Medications on File Prior to Visit  Medication Sig Dispense Refill   amLODipine (NORVASC) 2.5 MG tablet TAKE 1 TABLET BY MOUTH  EVERY DAY 90 tablet 3   atorvastatin (LIPITOR) 20 MG tablet TAKE 1 TABLET BY MOUTH EVERY DAY 90 tablet 2   Cholecalciferol (VITAMIN D3) 1000 units CAPS Take by mouth daily.     Estradiol (VAGIFEM) 10 MCG TABS vaginal tablet PLACE 1 TABLET (10 MCG TOTAL) VAGINALLY ONCE A WEEK. 16 tablet 1   famotidine (PEPCID) 20 MG tablet Take 20 mg by mouth 2 (two) times daily.     levocetirizine (XYZAL) 5 MG tablet Take 5 mg by mouth every evening.     metoprolol succinate (TOPROL-XL) 50 MG 24 hr tablet Take with or immediately following a meal. 90 tablet 3   mometasone (NASONEX) 50 MCG/ACT nasal spray PLACE 2 SPRAYS INTO THE NOSE DAILY. 17 each 1   Multiple Vitamins-Minerals (WOMENS MULTIVITAMIN) TABS Take by mouth daily.     Azelastine HCl 137 MCG/SPRAY SOLN PLACE 1 SPRAY INTO BOTH NOSTRILS 2 (TWO) TIMES DAILY. USE IN EACH NOSTRIL AS DIRECTED 90 mL 1   No current facility-administered medications on file prior to visit.    Review of Systems    Objective:    BP 124/78   Pulse (!) 108   Temp 98.7 F (37.1 C) (Oral)   Ht 5\' 8"  (1.727 m)   Wt 140 lb 9.6 oz (63.8 kg)   SpO2 99%   BMI 21.38 kg/m  BP Readings from Last 3 Encounters:  04/10/22 124/78  08/14/21 104/62  11/09/20 (!) 151/90   Wt Readings from Last 3 Encounters:  04/10/22 140  lb 9.6 oz (63.8 kg)  08/14/21 137 lb 12.8 oz (62.5 kg)  11/09/20 135 lb (61.2 kg)    Physical Exam Vitals reviewed.  Constitutional:      Appearance: She is well-developed.  HENT:     Head: Normocephalic and atraumatic.     Right Ear: Hearing, tympanic membrane, ear canal and external ear normal. No decreased hearing noted. No drainage, swelling or tenderness. No middle ear effusion. No foreign body. Tympanic membrane is not erythematous or bulging.     Left Ear: Hearing, tympanic membrane, ear canal and external ear normal. No decreased hearing noted. No drainage, swelling or tenderness.  No middle ear effusion. No foreign body. Tympanic membrane is not  erythematous or bulging.     Nose: Nose normal. No rhinorrhea.     Right Sinus: No maxillary sinus tenderness or frontal sinus tenderness.     Left Sinus: No maxillary sinus tenderness or frontal sinus tenderness.     Mouth/Throat:     Pharynx: Uvula midline. No oropharyngeal exudate or posterior oropharyngeal erythema.     Tonsils: No tonsillar abscesses.  Eyes:     Conjunctiva/sclera: Conjunctivae normal.  Cardiovascular:     Rate and Rhythm: Regular rhythm.     Pulses: Normal pulses.     Heart sounds: Normal heart sounds.  Pulmonary:     Effort: Pulmonary effort is normal.     Breath sounds: Normal breath sounds. No wheezing, rhonchi or rales.  Lymphadenopathy:     Head:     Right side of head: No submental, submandibular, tonsillar, preauricular, posterior auricular or occipital adenopathy.     Left side of head: No submental, submandibular, tonsillar, preauricular, posterior auricular or occipital adenopathy.     Cervical: No cervical adenopathy.  Skin:    General: Skin is warm and dry.  Neurological:     Mental Status: She is alert.  Psychiatric:        Speech: Speech normal.        Behavior: Behavior normal.        Thought Content: Thought content normal.

## 2022-04-24 ENCOUNTER — Ambulatory Visit (INDEPENDENT_AMBULATORY_CARE_PROVIDER_SITE_OTHER): Payer: BC Managed Care – PPO

## 2022-04-24 DIAGNOSIS — Z23 Encounter for immunization: Secondary | ICD-10-CM

## 2022-04-24 NOTE — Progress Notes (Signed)
Patient presented for shingrix vaccine to left deltoid, patient voiced no concerns nor showed any signs of distress during injection

## 2022-05-14 ENCOUNTER — Ambulatory Visit
Admission: RE | Admit: 2022-05-14 | Discharge: 2022-05-14 | Disposition: A | Discharge status: Home / Self Care | Payer: BC Managed Care – PPO | Source: Ambulatory Visit | Attending: Family | Admitting: Family

## 2022-05-14 DIAGNOSIS — Z1231 Encounter for screening mammogram for malignant neoplasm of breast: Secondary | ICD-10-CM | POA: Insufficient documentation

## 2022-06-06 ENCOUNTER — Other Ambulatory Visit: Payer: Self-pay | Admitting: Family

## 2022-06-06 DIAGNOSIS — I10 Essential (primary) hypertension: Secondary | ICD-10-CM

## 2022-06-11 ENCOUNTER — Other Ambulatory Visit: Payer: Self-pay | Admitting: Family

## 2022-06-11 DIAGNOSIS — N952 Postmenopausal atrophic vaginitis: Secondary | ICD-10-CM

## 2022-08-27 ENCOUNTER — Encounter: Payer: BC Managed Care – PPO | Admitting: Family

## 2022-08-28 ENCOUNTER — Ambulatory Visit (INDEPENDENT_AMBULATORY_CARE_PROVIDER_SITE_OTHER): Payer: BC Managed Care – PPO | Admitting: Family

## 2022-08-28 ENCOUNTER — Other Ambulatory Visit (HOSPITAL_COMMUNITY)
Admission: RE | Admit: 2022-08-28 | Discharge: 2022-08-28 | Disposition: A | Discharge status: Home / Self Care | Payer: BC Managed Care – PPO | Source: Ambulatory Visit | Attending: Family | Admitting: Family

## 2022-08-28 ENCOUNTER — Encounter: Payer: Self-pay | Admitting: Family

## 2022-08-28 VITALS — BP 128/76 | Temp 97.2°F | Ht 68.5 in | Wt 142.8 lb

## 2022-08-28 DIAGNOSIS — Z23 Encounter for immunization: Secondary | ICD-10-CM | POA: Diagnosis not present

## 2022-08-28 DIAGNOSIS — Z0000 Encounter for general adult medical examination without abnormal findings: Secondary | ICD-10-CM

## 2022-08-28 DIAGNOSIS — Z8639 Personal history of other endocrine, nutritional and metabolic disease: Secondary | ICD-10-CM

## 2022-08-28 DIAGNOSIS — Z780 Asymptomatic menopausal state: Secondary | ICD-10-CM

## 2022-08-28 DIAGNOSIS — E785 Hyperlipidemia, unspecified: Secondary | ICD-10-CM | POA: Diagnosis not present

## 2022-08-28 DIAGNOSIS — I10 Essential (primary) hypertension: Secondary | ICD-10-CM

## 2022-08-28 LAB — CBC WITH DIFFERENTIAL/PLATELET
Basophils Absolute: 0.1 10*3/uL (ref 0.0–0.1)
Basophils Relative: 0.7 % (ref 0.0–3.0)
Eosinophils Absolute: 0.3 10*3/uL (ref 0.0–0.7)
Eosinophils Relative: 3.9 % (ref 0.0–5.0)
HCT: 40 % (ref 36.0–46.0)
Hemoglobin: 13.8 g/dL (ref 12.0–15.0)
Lymphocytes Relative: 47.3 % — ABNORMAL HIGH (ref 12.0–46.0)
Lymphs Abs: 3.4 10*3/uL (ref 0.7–4.0)
MCHC: 34.5 g/dL (ref 30.0–36.0)
MCV: 89.6 fl (ref 78.0–100.0)
Monocytes Absolute: 0.5 10*3/uL (ref 0.1–1.0)
Monocytes Relative: 7.5 % (ref 3.0–12.0)
Neutro Abs: 2.9 10*3/uL (ref 1.4–7.7)
Neutrophils Relative %: 40.6 % — ABNORMAL LOW (ref 43.0–77.0)
Platelets: 210 10*3/uL (ref 150.0–400.0)
RBC: 4.47 Mil/uL (ref 3.87–5.11)
RDW: 13.6 % (ref 11.5–15.5)
WBC: 7.1 10*3/uL (ref 4.0–10.5)

## 2022-08-28 LAB — LIPID PANEL
Cholesterol: 179 mg/dL (ref 0–200)
HDL: 60.9 mg/dL (ref >39.00)
LDL Cholesterol: 91 mg/dL (ref 0–99)
NonHDL: 117.78
Total CHOL/HDL Ratio: 3
Triglycerides: 134 mg/dL (ref 0.0–149.0)
VLDL: 26.8 mg/dL (ref 0.0–40.0)

## 2022-08-28 LAB — COMPREHENSIVE METABOLIC PANEL
ALT: 22 U/L (ref 0–35)
AST: 24 U/L (ref 0–37)
Albumin: 4.4 g/dL (ref 3.5–5.2)
Alkaline Phosphatase: 65 U/L (ref 39–117)
BUN: 20 mg/dL (ref 6–23)
CO2: 28 mEq/L (ref 19–32)
Calcium: 9.3 mg/dL (ref 8.4–10.5)
Chloride: 102 mEq/L (ref 96–112)
Creatinine, Ser: 0.77 mg/dL (ref 0.40–1.20)
GFR: 81.36 mL/min (ref >60.00)
Glucose, Bld: 55 mg/dL — ABNORMAL LOW (ref 70–99)
Potassium: 4.2 mEq/L (ref 3.5–5.1)
Sodium: 137 mEq/L (ref 135–145)
Total Bilirubin: 1 mg/dL (ref 0.2–1.2)
Total Protein: 7.3 g/dL (ref 6.0–8.3)

## 2022-08-28 LAB — VITAMIN D 25 HYDROXY (VIT D DEFICIENCY, FRACTURES): VITD: 27.7 ng/mL — ABNORMAL LOW (ref 30.00–100.00)

## 2022-08-28 LAB — TSH: TSH: 1.68 u[IU]/mL (ref 0.35–5.50)

## 2022-08-28 MED ORDER — ATORVASTATIN CALCIUM 20 MG PO TABS: 20.0000 mg | ORAL_TABLET | Freq: Every day | ORAL | 3 refills | Status: DC

## 2022-08-28 MED ORDER — METOPROLOL SUCCINATE ER 50 MG PO TB24: ORAL_TABLET | ORAL | 3 refills | Status: DC

## 2022-08-28 NOTE — Assessment & Plan Note (Signed)
CBE  preformed today.  Pap smear repeated.  Shingrix provided.  Patient will return for Tdap vaccine.  Congratulated patient on diligence to exercise.

## 2022-08-28 NOTE — Progress Notes (Signed)
Assessment & Plan:  Routine general medical examination at a health care facility Assessment & Plan: CBE  preformed today.  Pap smear repeated.  Shingrix provided.  Patient will return for Tdap vaccine.  Congratulated patient on diligence to exercise.   Orders: -     TSH -     Cytology - PAP -     Ambulatory referral to Dermatology  Hypertension, unspecified type Assessment & Plan: Chronic, stable.  Continue amlodipine 2.5 mg daily , metoprolol 50 mg daily.    Orders: -     Comprehensive metabolic panel -     TSH -     Metoprolol Succinate ER; Take with or immediately following a meal.  Dispense: 90 tablet; Refill: 3 -     Atorvastatin Calcium; Take 1 tablet (20 mg total) by mouth daily.  Dispense: 90 tablet; Refill: 3  Hyperlipidemia with target low density lipoprotein (LDL) cholesterol less than 100 mg/dL -     CBC with Differential/Platelet -     Lipid panel -     Atorvastatin Calcium; Take 1 tablet (20 mg total) by mouth daily.  Dispense: 90 tablet; Refill: 3  Asymptomatic postmenopausal state -     VITAMIN D 25 Hydroxy (Vit-D Deficiency, Fractures)  History of vitamin D deficiency -     VITAMIN D 25 Hydroxy (Vit-D Deficiency, Fractures)  Need for shingles vaccine -     Varicella-zoster vaccine IM     Return precautions given.   Risks, benefits, and alternatives of the medications and treatment plan prescribed today were discussed, and patient expressed understanding.   Education regarding symptom management and diagnosis given to patient on AVS either electronically or printed.  No follow-ups on file.  Rennie Plowman, FNP  Subjective:    Patient ID: Natasha Pace, female    DOB: Mar 04, 1956, 67 y.o.   MRN: [id number]  CC: Natasha Pace is a 67 y.o. female who presents today for physical exam.    HPI: Feels well today No new complaints     No new skin lesions.  In the past she has seen dermatology for annual skin check  Colorectal Cancer Screening:  UTD , 11/09/2020, repeat in 10 years time Breast Cancer Screening: Mammogram UTD Cervical Cancer Screening: Due, negative HPV 2 years ago [facility]- [place]. She did not see GYN at that time.   Bone Health screening/DEXA for 65+: No increased fracture risk. Defer screening at this time.  Lung Cancer Screening: Doesn't have 20 year pack year history and age > 56 years yo 35 years        Tetanus - UTD Exercise: Gets regular exercise, walking.  Alcohol use:  occassional Smoking/tobacco use: Nonsmoker.    Health Maintenance  Topic Date Due   DTaP/Tdap/Td vaccine (2 - Td or Tdap) 11/04/2021   COVID-19 Vaccine (5 - 2023-24 season) 12/22/2021   Flu Shot  11/22/2022   Pap Smear  07/27/2023   Mammogram  05/14/2024   Colon Cancer Screening  11/10/2030   Hepatitis C Screening: USPSTF Recommendation to screen - Ages 18-79 yo.  Completed   HIV Screening  Completed   Zoster (Shingles) Vaccine  Completed   HPV Vaccine  Aged Out    ALLERGIES: Patient has no known allergies.  Current Outpatient Medications on File Prior to Visit  Medication Sig Dispense Refill   amLODipine (NORVASC) 2.5 MG tablet TAKE 1 TABLET BY MOUTH EVERY DAY 90 tablet 3   Cholecalciferol (VITAMIN D3) 1000 units CAPS Take by mouth  daily.     Estradiol (VAGIFEM) 10 MCG TABS vaginal tablet PLACE 1 TABLET (10 MCG TOTAL) VAGINALLY ONCE A WEEK. 12 tablet 3   famotidine (PEPCID) 20 MG tablet Take 20 mg by mouth 2 (two) times daily.     levocetirizine (XYZAL) 5 MG tablet Take 5 mg by mouth every evening.     mometasone (NASONEX) 50 MCG/ACT nasal spray PLACE 2 SPRAYS INTO THE NOSE DAILY. 17 each 1   Multiple Vitamins-Minerals (WOMENS MULTIVITAMIN) TABS Take by mouth daily.     No current facility-administered medications on file prior to visit.    Review of Systems  Constitutional:  Negative for chills, fever and unexpected weight change.  HENT:  Negative for congestion.   Respiratory:  Negative for cough.   Cardiovascular:  Negative for  chest pain, palpitations and leg swelling.  Gastrointestinal:  Negative for nausea and vomiting.  Genitourinary:  Negative for pelvic pain.  Musculoskeletal:  Negative for arthralgias and myalgias.  Skin:  Negative for rash.  Neurological:  Negative for headaches.  Hematological:  Negative for adenopathy.  Psychiatric/Behavioral:  Negative for confusion.       Objective:    BP 128/76   Temp (!) 97.2 F (36.2 C) (Oral)   Ht 5' 8.5" (1.74 m)   Wt 142 lb 12.8 oz (64.8 kg)   BMI 21.40 kg/m   BP Readings from Last 3 Encounters:  08/28/22 128/76  04/10/22 124/78  08/14/21 104/62   Wt Readings from Last 3 Encounters:  08/28/22 142 lb 12.8 oz (64.8 kg)  04/10/22 140 lb 9.6 oz (63.8 kg)  08/14/21 137 lb 12.8 oz (62.5 kg)    Physical Exam Vitals reviewed.  Constitutional:      Appearance: Normal appearance. She is well-developed.  Eyes:     Conjunctiva/sclera: Conjunctivae normal.  Neck:     Thyroid: No thyroid mass or thyromegaly.  Cardiovascular:     Rate and Rhythm: Normal rate and regular rhythm.     Pulses: Normal pulses.     Heart sounds: Normal heart sounds.  Pulmonary:     Effort: Pulmonary effort is normal.     Breath sounds: Normal breath sounds. No wheezing, rhonchi or rales.  Chest:  Breasts:    Breasts are symmetrical.     Right: No inverted nipple, mass, nipple discharge, skin change or tenderness.     Left: No inverted nipple, mass, nipple discharge, skin change or tenderness.  Abdominal:     General: Bowel sounds are normal. There is no distension.     Palpations: Abdomen is soft. Abdomen is not rigid. There is no fluid wave or mass.     Tenderness: There is no abdominal tenderness. There is no guarding or rebound.  Genitourinary:    Cervix: No cervical motion tenderness, discharge or friability.     Uterus: Not enlarged, not fixed and not tender.      Adnexa:        Right: No mass, tenderness or fullness.         Left: No mass, tenderness or  fullness.       Comments: Pap performed. No CMT. Unable to appreciated ovaries. Lymphadenopathy:     Head:     Right side of head: No submental, submandibular, tonsillar, preauricular, posterior auricular or occipital adenopathy.     Left side of head: No submental, submandibular, tonsillar, preauricular, posterior auricular or occipital adenopathy.     Cervical:     Right cervical: No superficial, deep or posterior  cervical adenopathy.    Left cervical: No superficial, deep or posterior cervical adenopathy.     Upper Body:     Right upper body: No pectoral adenopathy.     Left upper body: No pectoral adenopathy.  Skin:    General: Skin is warm and dry.  Neurological:     Mental Status: She is alert.  Psychiatric:        Speech: Speech normal.        Behavior: Behavior normal.        Thought Content: Thought content normal.

## 2022-08-28 NOTE — Patient Instructions (Signed)
Referral to [facility] dermatology Let us know if you dont hear back within a week in regards to an appointment being scheduled.   So that you are aware, if you are [facility] MyChart user , please pay attention to your MyChart messages as you may receive a MyChart message with a phone number to call and schedule this test/appointment own your own from our referral coordinator. This is a new process so I do not want you to miss this message.  If you are not a MyChart user, you will receive a phone call.   Health Maintenance for Postmenopausal Women Menopause is a normal process in which your ability to get pregnant comes to an end. This process happens slowly over many months or years, usually between the ages of 28 and 16. Menopause is complete when you have missed your menstrual period for 12 months. It is important to talk with your health care provider about some of the most common conditions that affect women after menopause (postmenopausal women). These include heart disease, cancer, and bone loss (osteoporosis). Adopting a healthy lifestyle and getting preventive care can help to promote your health and wellness. The actions you take can also lower your chances of developing some of these common conditions. What are the signs and symptoms of menopause? During menopause, you may have the following symptoms: Hot flashes. These can be moderate or severe. Night sweats. Decrease in sex drive. Mood swings. Headaches. Tiredness (fatigue). Irritability. Memory problems. Problems falling asleep or staying asleep. Talk with your health care provider about treatment options for your symptoms. Do I need hormone replacement therapy? Hormone replacement therapy is effective in treating symptoms that are caused by menopause, such as hot flashes and night sweats. Hormone replacement carries certain risks, especially as you become older. If you are thinking about using estrogen or estrogen with progestin,  discuss the benefits and risks with your health care provider. How can I reduce my risk for heart disease and stroke? The risk of heart disease, heart attack, and stroke increases as you age. One of the causes may be a change in the body's hormones during menopause. This can affect how your body uses dietary fats, triglycerides, and cholesterol. Heart attack and stroke are medical emergencies. There are many things that you can do to help prevent heart disease and stroke. Watch your blood pressure High blood pressure causes heart disease and increases the risk of stroke. This is more likely to develop in people who have high blood pressure readings or are overweight. Have your blood pressure checked: Every 3-5 years if you are 24-87 years of age. Every year if you are 67 years old or older. Eat a healthy diet  Eat a diet that includes plenty of vegetables, fruits, low-fat dairy products, and lean protein. Do not eat a lot of foods that are high in solid fats, added sugars, or sodium. Get regular exercise Get regular exercise. This is one of the most important things you can do for your health. Most adults should: Try to exercise for at least 150 minutes each week. The exercise should increase your heart rate and make you sweat (moderate-intensity exercise). Try to do strengthening exercises at least twice each week. Do these in addition to the moderate-intensity exercise. Spend less time sitting. Even light physical activity can be beneficial. Other tips Work with your health care provider to achieve or maintain a healthy weight. Do not use any products that contain nicotine or tobacco. These products include cigarettes, chewing  tobacco, and vaping devices, such as e-cigarettes. If you need help quitting, ask your health care provider. Know your numbers. Ask your health care provider to check your cholesterol and your blood sugar (glucose). Continue to have your blood tested as directed by your  health care provider. Do I need screening for cancer? Depending on your health history and family history, you may need to have cancer screenings at different stages of your life. This may include screening for: Breast cancer. Cervical cancer. Lung cancer. Colorectal cancer. What is my risk for osteoporosis? After menopause, you may be at increased risk for osteoporosis. Osteoporosis is a condition in which bone destruction happens more quickly than new bone creation. To help prevent osteoporosis or the bone fractures that can happen because of osteoporosis, you may take the following actions: If you are 61-6 years old, get at least 1,000 mg of calcium and at least 600 international units (IU) of vitamin D per day. If you are older than age 65 but younger than age 47, get at least 1,200 mg of calcium and at least 600 international units (IU) of vitamin D per day. If you are older than age 69, get at least 1,200 mg of calcium and at least 800 international units (IU) of vitamin D per day. Smoking and drinking excessive alcohol increase the risk of osteoporosis. Eat foods that are rich in calcium and vitamin D, and do weight-bearing exercises several times each week as directed by your health care provider. How does menopause affect my mental health? Depression may occur at any age, but it is more common as you become older. Common symptoms of depression include: Feeling depressed. Changes in sleep patterns. Changes in appetite or eating patterns. Feeling an overall lack of motivation or enjoyment of activities that you previously enjoyed. Frequent crying spells. Talk with your health care provider if you think that you are experiencing any of these symptoms. General instructions See your health care provider for regular wellness exams and vaccines. This may include: Scheduling regular health, dental, and eye exams. Getting and maintaining your vaccines. These include: Influenza vaccine. Get  this vaccine each year before the flu season begins. Pneumonia vaccine. Shingles vaccine. Tetanus, diphtheria, and pertussis (Tdap) booster vaccine. Your health care provider may also recommend other immunizations. Tell your health care provider if you have ever been abused or do not feel safe at home. Summary Menopause is a normal process in which your ability to get pregnant comes to an end. This condition causes hot flashes, night sweats, decreased interest in sex, mood swings, headaches, or lack of sleep. Treatment for this condition may include hormone replacement therapy. Take actions to keep yourself healthy, including exercising regularly, eating a healthy diet, watching your weight, and checking your blood pressure and blood sugar levels. Get screened for cancer and depression. Make sure that you are up to date with all your vaccines. This information is not intended to replace advice given to you by your health care provider. Make sure you discuss any questions you have with your health care provider. Document Revised: 08/29/2020 Document Reviewed: 08/29/2020 Elsevier Patient Education  2023 [employer].

## 2022-08-28 NOTE — Assessment & Plan Note (Signed)
Chronic, stable.  Continue amlodipine 2.5 mg daily , metoprolol 50 mg daily.

## 2022-08-30 LAB — CYTOLOGY - PAP
Comment: NEGATIVE
Diagnosis: NEGATIVE
High risk HPV: NEGATIVE

## 2022-09-07 ENCOUNTER — Other Ambulatory Visit: Payer: Self-pay

## 2022-09-07 DIAGNOSIS — I10 Essential (primary) hypertension: Secondary | ICD-10-CM

## 2022-09-11 ENCOUNTER — Ambulatory Visit: Payer: BC Managed Care – PPO

## 2022-09-13 ENCOUNTER — Ambulatory Visit (INDEPENDENT_AMBULATORY_CARE_PROVIDER_SITE_OTHER): Payer: BC Managed Care – PPO

## 2022-09-13 ENCOUNTER — Encounter: Payer: Self-pay | Admitting: Family

## 2022-09-13 DIAGNOSIS — Z23 Encounter for immunization: Secondary | ICD-10-CM

## 2022-09-13 NOTE — Progress Notes (Signed)
Patient arrived for a Tdap it was administered into her left deltoid. Patient tolerated the injection well and did not show any signs of distress or voice any concerns.

## 2022-09-14 ENCOUNTER — Other Ambulatory Visit: Payer: Self-pay | Admitting: Family

## 2022-09-14 DIAGNOSIS — Z8639 Personal history of other endocrine, nutritional and metabolic disease: Secondary | ICD-10-CM

## 2022-09-14 MED ORDER — CHOLECALCIFEROL 1.25 MG (50000 UT) PO TABS
ORAL_TABLET | ORAL | 0 refills | Status: DC
Start: 2022-09-14 — End: 2024-02-24

## 2022-10-19 ENCOUNTER — Other Ambulatory Visit: Payer: BC Managed Care – PPO

## 2022-11-12 ENCOUNTER — Other Ambulatory Visit (INDEPENDENT_AMBULATORY_CARE_PROVIDER_SITE_OTHER): Payer: BC Managed Care – PPO

## 2022-11-12 DIAGNOSIS — I10 Essential (primary) hypertension: Secondary | ICD-10-CM

## 2022-11-12 LAB — CBC WITH DIFFERENTIAL/PLATELET
Basophils Absolute: 0 10*3/uL (ref 0.0–0.1)
Basophils Relative: 0.4 % (ref 0.0–3.0)
Eosinophils Absolute: 0.2 10*3/uL (ref 0.0–0.7)
Eosinophils Relative: 2.4 % (ref 0.0–5.0)
HCT: 42.4 % (ref 36.0–46.0)
Hemoglobin: 13.8 g/dL (ref 12.0–15.0)
Lymphocytes Relative: 52.9 % — ABNORMAL HIGH (ref 12.0–46.0)
Lymphs Abs: 3.7 10*3/uL (ref 0.7–4.0)
MCHC: 32.6 g/dL (ref 30.0–36.0)
MCV: 90.1 fl (ref 78.0–100.0)
Monocytes Absolute: 0.4 10*3/uL (ref 0.1–1.0)
Monocytes Relative: 6.3 % (ref 3.0–12.0)
Neutro Abs: 2.7 10*3/uL (ref 1.4–7.7)
Neutrophils Relative %: 38 % — ABNORMAL LOW (ref 43.0–77.0)
Platelets: 216 10*3/uL (ref 150.0–400.0)
RBC: 4.71 Mil/uL (ref 3.87–5.11)
RDW: 14.2 % (ref 11.5–15.5)
WBC: 7 10*3/uL (ref 4.0–10.5)

## 2022-11-15 ENCOUNTER — Telehealth: Payer: Self-pay

## 2022-11-15 NOTE — Telephone Encounter (Signed)
Pt returned [facility] CMA call. Note below was read to her. Pt its book for lab for 01/07/23.

## 2022-11-15 NOTE — Telephone Encounter (Signed)
NOTED

## 2022-11-15 NOTE — Telephone Encounter (Signed)
LVM to inform pt she needs to schedule repeat labs in 8 weeks

## 2022-11-25 ENCOUNTER — Encounter: Payer: Self-pay | Admitting: Family

## 2022-11-26 ENCOUNTER — Other Ambulatory Visit: Payer: Self-pay | Admitting: Family

## 2022-11-26 DIAGNOSIS — N952 Postmenopausal atrophic vaginitis: Secondary | ICD-10-CM

## 2022-11-26 MED ORDER — ESTRADIOL 10 MCG VA TABS: ORAL_TABLET | VAGINAL | 3 refills | Status: DC

## 2022-12-03 ENCOUNTER — Other Ambulatory Visit: Payer: Self-pay | Admitting: Family

## 2022-12-03 DIAGNOSIS — I10 Essential (primary) hypertension: Secondary | ICD-10-CM

## 2023-01-02 ENCOUNTER — Telehealth: Payer: Self-pay | Admitting: Family

## 2023-01-02 DIAGNOSIS — I10 Essential (primary) hypertension: Secondary | ICD-10-CM

## 2023-01-02 NOTE — Telephone Encounter (Signed)
Patient need lab orders.

## 2023-01-03 NOTE — Telephone Encounter (Signed)
DONE

## 2023-01-07 ENCOUNTER — Other Ambulatory Visit (INDEPENDENT_AMBULATORY_CARE_PROVIDER_SITE_OTHER): Payer: Medicare HMO

## 2023-01-07 ENCOUNTER — Ambulatory Visit (INDEPENDENT_AMBULATORY_CARE_PROVIDER_SITE_OTHER): Payer: Medicare HMO

## 2023-01-07 DIAGNOSIS — Z23 Encounter for immunization: Secondary | ICD-10-CM

## 2023-01-07 DIAGNOSIS — I10 Essential (primary) hypertension: Secondary | ICD-10-CM | POA: Diagnosis not present

## 2023-01-07 LAB — CBC WITH DIFFERENTIAL/PLATELET
Basophils Absolute: 0 10*3/uL (ref 0.0–0.1)
Basophils Relative: 0.6 % (ref 0.0–3.0)
Eosinophils Absolute: 0.1 10*3/uL (ref 0.0–0.7)
Eosinophils Relative: 1.8 % (ref 0.0–5.0)
HCT: 41 % (ref 36.0–46.0)
Hemoglobin: 13.4 g/dL (ref 12.0–15.0)
Lymphocytes Relative: 48.7 % — ABNORMAL HIGH (ref 12.0–46.0)
Lymphs Abs: 3.3 10*3/uL (ref 0.7–4.0)
MCHC: 32.6 g/dL (ref 30.0–36.0)
MCV: 89.1 fl (ref 78.0–100.0)
Monocytes Absolute: 0.4 10*3/uL (ref 0.1–1.0)
Monocytes Relative: 6.4 % (ref 3.0–12.0)
Neutro Abs: 2.8 10*3/uL (ref 1.4–7.7)
Neutrophils Relative %: 42.5 % — ABNORMAL LOW (ref 43.0–77.0)
Platelets: 200 10*3/uL (ref 150.0–400.0)
RBC: 4.61 Mil/uL (ref 3.87–5.11)
RDW: 14.6 % (ref 11.5–15.5)
WBC: 6.7 10*3/uL (ref 4.0–10.5)

## 2023-01-11 ENCOUNTER — Other Ambulatory Visit: Payer: Self-pay

## 2023-01-11 DIAGNOSIS — I10 Essential (primary) hypertension: Secondary | ICD-10-CM

## 2023-04-12 ENCOUNTER — Other Ambulatory Visit: Payer: Medicare HMO

## 2023-04-21 ENCOUNTER — Encounter: Payer: Self-pay | Admitting: Family

## 2023-04-22 ENCOUNTER — Other Ambulatory Visit: Payer: Self-pay

## 2023-04-22 DIAGNOSIS — N952 Postmenopausal atrophic vaginitis: Secondary | ICD-10-CM

## 2023-04-22 MED ORDER — ESTRADIOL 10 MCG VA TABS: ORAL_TABLET | VAGINAL | 3 refills | Status: DC

## 2023-05-06 ENCOUNTER — Encounter: Payer: Self-pay | Admitting: Family

## 2023-05-06 ENCOUNTER — Other Ambulatory Visit: Payer: Self-pay

## 2023-05-15 ENCOUNTER — Other Ambulatory Visit: Payer: Self-pay | Admitting: Family

## 2023-05-15 DIAGNOSIS — Z1231 Encounter for screening mammogram for malignant neoplasm of breast: Secondary | ICD-10-CM

## 2023-05-30 ENCOUNTER — Ambulatory Visit
Admission: RE | Admit: 2023-05-30 | Discharge: 2023-05-30 | Disposition: A | Discharge status: Home / Self Care | Payer: Medicare Other | Source: Ambulatory Visit | Attending: Family | Admitting: Family

## 2023-05-30 DIAGNOSIS — Z1231 Encounter for screening mammogram for malignant neoplasm of breast: Secondary | ICD-10-CM | POA: Diagnosis not present

## 2023-06-03 ENCOUNTER — Other Ambulatory Visit: Payer: Self-pay | Admitting: Family

## 2023-06-03 DIAGNOSIS — I10 Essential (primary) hypertension: Secondary | ICD-10-CM

## 2023-06-03 DIAGNOSIS — E785 Hyperlipidemia, unspecified: Secondary | ICD-10-CM

## 2023-06-04 ENCOUNTER — Encounter: Payer: Self-pay | Admitting: Family

## 2023-06-13 ENCOUNTER — Other Ambulatory Visit: Payer: Self-pay

## 2023-06-13 DIAGNOSIS — E785 Hyperlipidemia, unspecified: Secondary | ICD-10-CM

## 2023-06-13 DIAGNOSIS — I10 Essential (primary) hypertension: Secondary | ICD-10-CM

## 2023-06-13 MED ORDER — METOPROLOL SUCCINATE ER 50 MG PO TB24: ORAL_TABLET | ORAL | 3 refills | Status: DC

## 2023-06-13 MED ORDER — AMLODIPINE BESYLATE 2.5 MG PO TABS: 2.5000 mg | ORAL_TABLET | Freq: Every day | ORAL | 3 refills | Status: DC

## 2023-06-13 MED ORDER — ATORVASTATIN CALCIUM 20 MG PO TABS: 20.0000 mg | ORAL_TABLET | Freq: Every day | ORAL | 3 refills | Status: DC

## 2023-06-13 MED ORDER — LEVOCETIRIZINE DIHYDROCHLORIDE 5 MG PO TABS: 5.0000 mg | ORAL_TABLET | Freq: Every evening | ORAL | 2 refills | Status: AC

## 2023-06-23 ENCOUNTER — Encounter: Payer: Self-pay | Admitting: Family

## 2023-06-25 ENCOUNTER — Other Ambulatory Visit: Payer: Self-pay | Admitting: Family

## 2023-06-25 DIAGNOSIS — N952 Postmenopausal atrophic vaginitis: Secondary | ICD-10-CM

## 2023-06-25 MED ORDER — ESTRADIOL 10 MCG VA TABS
ORAL_TABLET | VAGINAL | 3 refills | Status: DC
Start: 2023-06-25 — End: 2023-09-26

## 2023-07-02 IMAGING — MG MM DIGITAL SCREENING BILAT W/ TOMO AND CAD
8 series · 9 of 24 positions shown · non-contrast
Comparison: Previous exam(s).

CLINICAL DATA: Screening.

EXAM:
DIGITAL SCREENING BILATERAL MAMMOGRAM WITH TOMOSYNTHESIS AND CAD
TECHNIQUE: Bilateral screening digital craniocaudal and mediolateral oblique
mammograms were obtained. Bilateral screening digital breast
tomosynthesis was performed. The images were evaluated with
computer-aided detection.

[L MLO synth-2D]
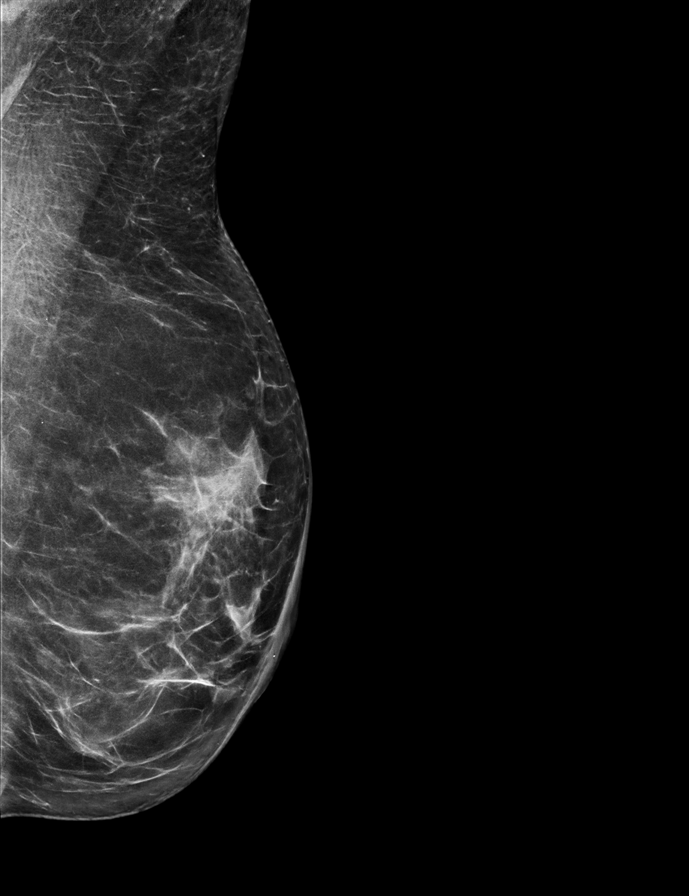

[L CC synth-2D]
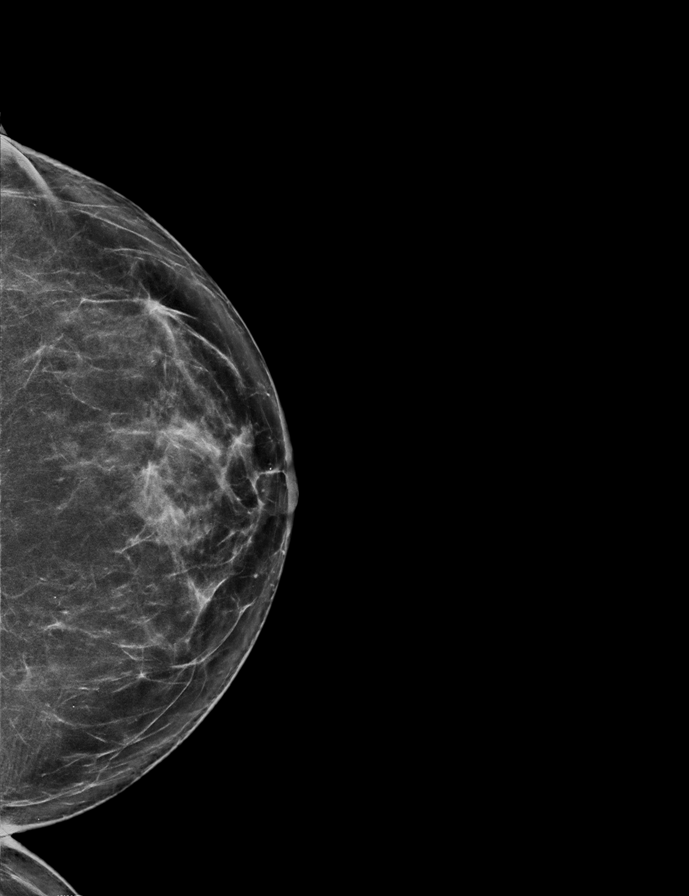

[R MLO synth-2D]
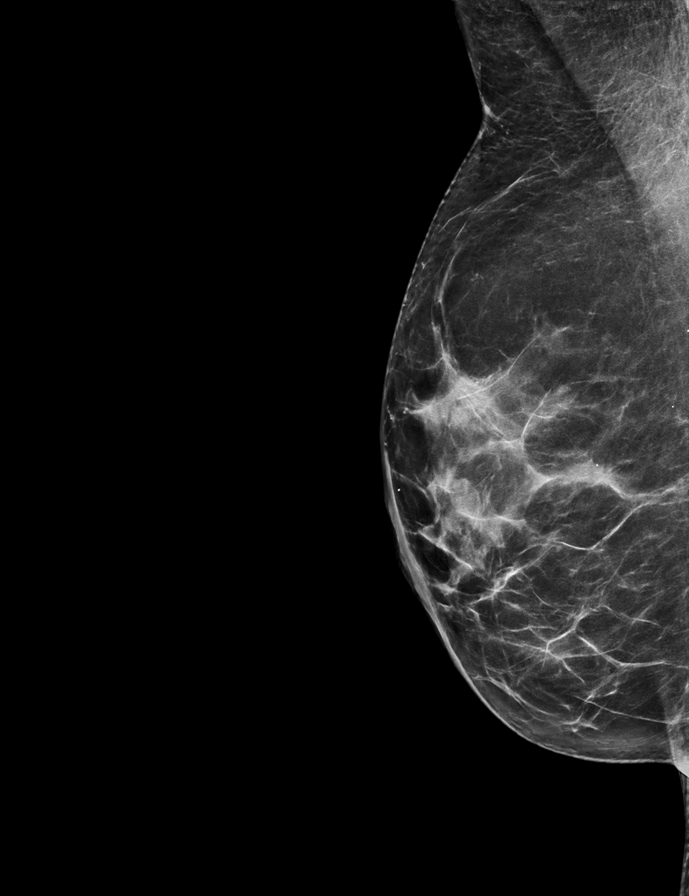

[R CC synth-2D]
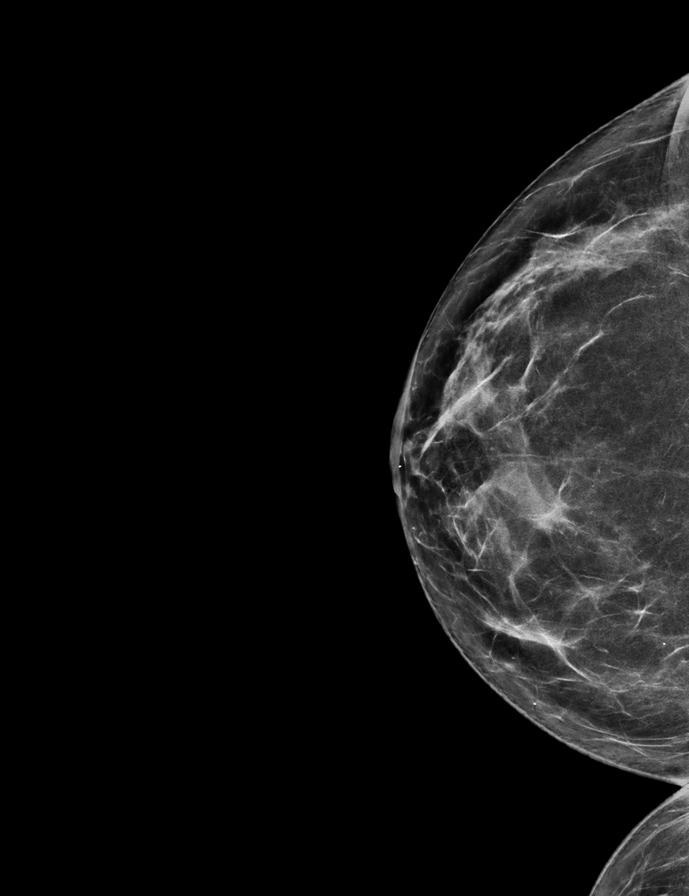

[L MLO tomo · 2 of 67 frames shown]
[frame 22/67]
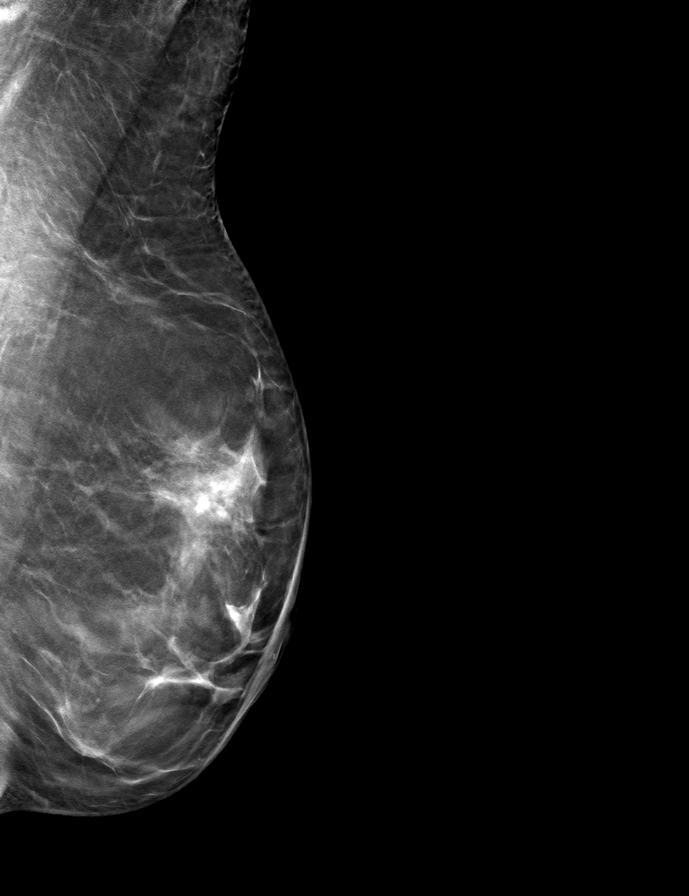
[frame 34/67]
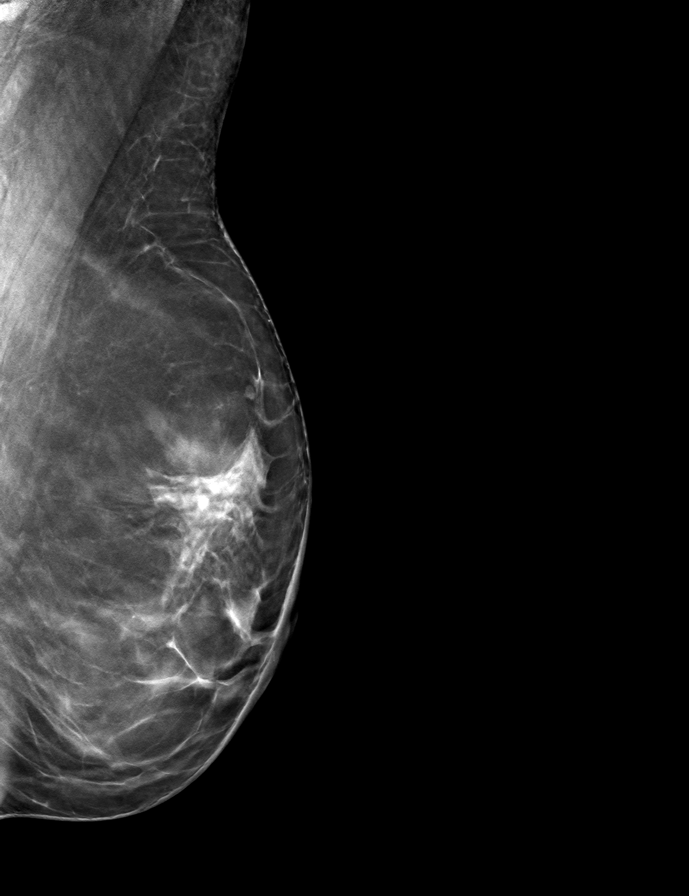

[L CC tomo · tomo slice 33/66.0]
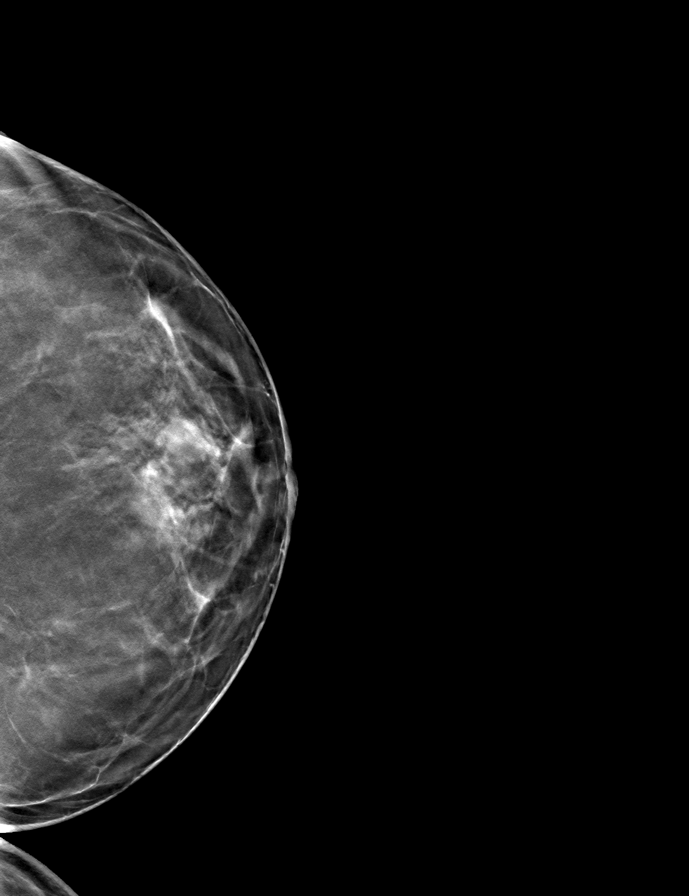

[R CC tomo · tomo slice 33/65.0]
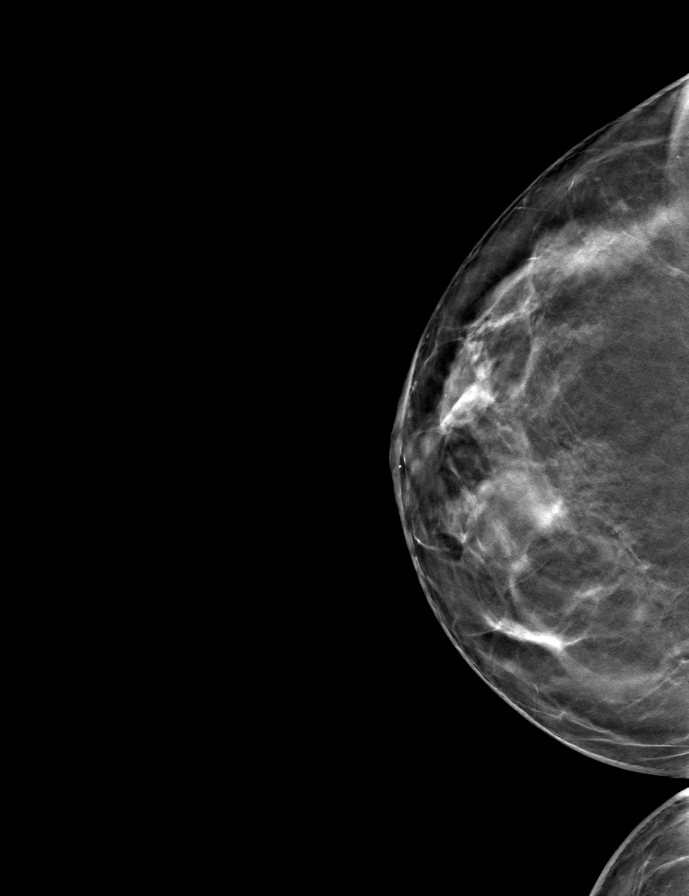

[R MLO tomo · tomo slice 33/65.0]
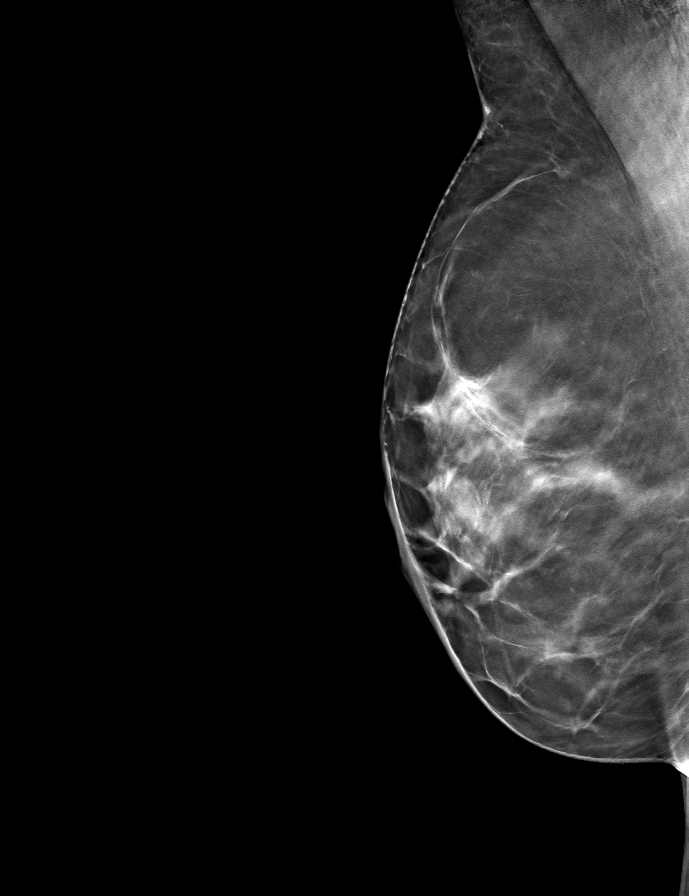

[9 of 24 positions shown; findings below may reference images not displayed]

ACR Breast Density Category c: The breast tissue is heterogeneously
dense, which may obscure small masses.
FINDINGS: There are no findings suspicious for malignancy.
IMPRESSION: No mammographic evidence of malignancy. A result letter of this
screening mammogram will be mailed directly to the patient.

RECOMMENDATION:
Screening mammogram in one year. (Code:Q3-W-BC3)

BI-RADS CATEGORY  1: Negative.

## 2023-08-15 DIAGNOSIS — K080 Exfoliation of teeth due to systemic causes: Secondary | ICD-10-CM | POA: Diagnosis not present

## 2023-08-28 ENCOUNTER — Encounter: Payer: Self-pay | Admitting: Family

## 2023-08-28 NOTE — Telephone Encounter (Signed)
 Spoke to pt she needs to cancel appt for 08/30/23.

## 2023-08-29 NOTE — Telephone Encounter (Signed)
 Spoke to pt she will call back and schedule as soon as she is feeling better

## 2023-08-30 ENCOUNTER — Other Ambulatory Visit: Payer: Self-pay

## 2023-08-30 ENCOUNTER — Encounter: Payer: BC Managed Care – PPO | Admitting: Family

## 2023-08-30 DIAGNOSIS — I10 Essential (primary) hypertension: Secondary | ICD-10-CM

## 2023-08-30 DIAGNOSIS — E785 Hyperlipidemia, unspecified: Secondary | ICD-10-CM

## 2023-08-30 MED ORDER — AMLODIPINE BESYLATE 2.5 MG PO TABS: 2.5000 mg | ORAL_TABLET | Freq: Every day | ORAL | 3 refills | Status: AC

## 2023-08-30 MED ORDER — ATORVASTATIN CALCIUM 20 MG PO TABS: 20.0000 mg | ORAL_TABLET | Freq: Every day | ORAL | 3 refills | Status: AC

## 2023-08-30 MED ORDER — METOPROLOL SUCCINATE ER 50 MG PO TB24: ORAL_TABLET | ORAL | 3 refills | Status: AC

## 2023-08-30 NOTE — Telephone Encounter (Signed)
 Spoke to pt she stated that she need Amolodopine,Atorvastatin , and Metoprolol  sent to [facility]. Rx have been sent in pt is aware

## 2023-09-10 NOTE — Telephone Encounter (Signed)
 Pt called back to r/s her cpe/[facility]elcome to [facility]. However, pcp is not available until 09/04 & she stated that [facility] wanted her to be seen before August while she is still 80. Please call & advise.

## 2023-09-11 NOTE — Telephone Encounter (Signed)
 Appt set for 09/26/23 for [facility]

## 2023-09-24 DIAGNOSIS — H43393 Other vitreous opacities, bilateral: Secondary | ICD-10-CM | POA: Diagnosis not present

## 2023-09-26 ENCOUNTER — Encounter: Payer: Self-pay | Admitting: Family

## 2023-09-26 ENCOUNTER — Ambulatory Visit (INDEPENDENT_AMBULATORY_CARE_PROVIDER_SITE_OTHER): Admitting: Family

## 2023-09-26 VITALS — BP 128/72 | HR 69 | Temp 98.0°F | Ht 68.5 in | Wt 139.2 lb

## 2023-09-26 DIAGNOSIS — Z780 Asymptomatic menopausal state: Secondary | ICD-10-CM

## 2023-09-26 DIAGNOSIS — I10 Essential (primary) hypertension: Secondary | ICD-10-CM | POA: Diagnosis not present

## 2023-09-26 DIAGNOSIS — Z0000 Encounter for general adult medical examination without abnormal findings: Secondary | ICD-10-CM | POA: Diagnosis not present

## 2023-09-26 DIAGNOSIS — E559 Vitamin D deficiency, unspecified: Secondary | ICD-10-CM | POA: Diagnosis not present

## 2023-09-26 DIAGNOSIS — R439 Unspecified disturbances of smell and taste: Secondary | ICD-10-CM | POA: Insufficient documentation

## 2023-09-26 DIAGNOSIS — Z136 Encounter for screening for cardiovascular disorders: Secondary | ICD-10-CM

## 2023-09-26 DIAGNOSIS — Z13220 Encounter for screening for lipoid disorders: Secondary | ICD-10-CM

## 2023-09-26 DIAGNOSIS — N952 Postmenopausal atrophic vaginitis: Secondary | ICD-10-CM

## 2023-09-26 MED ORDER — ESTRADIOL 10 MCG VA TABS: ORAL_TABLET | VAGINAL | 3 refills | Status: DC

## 2023-09-26 MED ORDER — AZELASTINE HCL 0.1 % NA SOLN: 1.0000 | Freq: Two times a day (BID) | NASAL | 4 refills | Status: AC

## 2023-09-26 MED ORDER — PREDNISONE 10 MG PO TABS
ORAL_TABLET | ORAL | 0 refills | Status: DC
Start: 2023-09-26 — End: 2024-02-24

## 2023-09-26 NOTE — Assessment & Plan Note (Addendum)
   Medications reviewed, patient not on opioids.   Baseline EKG obtained; sinus bradycardia ; t wave inversions without changes when compared to prior 12/18/2017 Screenings administered: Fall screening, depression scale ( PHQ9) , anxiety scale ( GAD 7 ), vision screening,6CIT,    No cognitive impairment assessed by observation. Assessed risk of fall, depression. Counseled on importance of end of life planning; she has her  healthcare power of attorney and living will completed.  Patient encouraged to bring this to office so we can add to chart.     Encouraged continued exercise.   Counseled on fall prevention, nutrition, physical activity, tobacco use cessation, weight loss, cognition.

## 2023-09-26 NOTE — Assessment & Plan Note (Signed)
 Decreased taste and smell after URI without neurologic symptoms.  Reassuring neurologic exam today.  Discussed bilateral otitis media, and/or eustachian tube dysfunction persistent after URI.  Continue Xyzal .  Start azelastine .  Provided prednisone taper after azelastine  if azelastine  is not effective.  Discussed olfactory retraining .  if altered taste and smell does not resolve, advised ENT consult, MRI brain.  She verbalized understanding will let me know how she is doing

## 2023-09-26 NOTE — Progress Notes (Signed)
 Subjective:    Natasha Pace is a 68 y.o. female who presents for a Welcome to Medicare exam.     She complains of decreased taste and smell x 2 months, improved. She didn't take for covid at that time. She describes as one of the 'worse' cold that she has had with cough and congestion; husband had similar sxs. She took mucinex DM during this time.  She is able to taste and smell, however feels diminished.  She can taste  sweet, sour, salty, spicy, and bitter. She can smell a candle for example however she has to hold candle close to her brain.    Onset after URI  She has felt ear are 'stopped up' since URI and continue to 'pop' She is compliant with Xyzal ; she is not taking nasonex .   No head injury Denies headache, vision, pnd,   No occupational exposures, second hand smoke exposure.   Walking daily.    Eats healthy diet.   Health Risk Assessment:   Reports able to perform activities of daily living including dressing , feeding, bathing, and toileting.     Reports able to perform instrumental activities of daily living including balancing checkbook, shopping, housekeeping, managing medications, and laundry.   Feels safe at home. Smoke detectors and co2 detectors in place.   No concerns for hearing impairment.  No concerns for vision impairment.    Advanced directives: Discussed living will, medical power of attorney ; she has both of these , uptodate at home.   Occasional alcohol Use Never smoker Denies drug use Denies h/o Substance Use disorder MM up-to-date 06/04/2023 Colonoscopy up-to-date 11/09/2020, repeat in 10 yrs Pap smear 08/28/22 negative HPV, malignancy  Depression screen:     08/28/2022    9:08 AM 04/10/2022   12:39 PM 08/14/2021    9:04 AM 02/20/2017   10:38 AM 05/14/2016    9:41 AM  Depression screen PHQ 2/9  Decreased Interest 0 0 0 0 0  Down, Depressed, Hopeless 0 0 0 0 0  PHQ - 2 Score 0 0 0 0 0  Altered sleeping 0      Tired, decreased  energy 0      Change in appetite 0      Feeling bad or failure about yourself  0      Trouble concentrating 0      Moving slowly or fidgety/restless 0      Suicidal thoughts 0      PHQ-9 Score 0      Difficult doing work/chores Not difficult at all         Past Medical History:  Diagnosis Date   Allergy    hay fever   Chicken pox    GERD (gastroesophageal reflux disease)    Hyperlipidemia    Hypertension    Vaginal polyp 2014   benign   Past Surgical History:  Procedure Laterality Date   COLONOSCOPY WITH PROPOFOL  N/A 11/09/2020   Procedure: COLONOSCOPY WITH PROPOFOL ;  Surgeon: Selena Daily, MD;  Location: [facility] ENDOSCOPY;  Service: Gastroenterology;  Laterality: N/A;   INTRAUTERINE DEVICE INSERTION     Removed 2014, Mirena, [facility], Melody Burr   VAGINAL DELIVERY     2   Family History  Problem Relation Age of Onset   Hypertension Mother    Diabetes Maternal Grandmother    Cancer Maternal Grandmother        breast   Breast cancer Maternal Grandmother 2   Hypertension Brother  Hypertension Father    Diabetes Maternal Grandfather    Ovarian cancer Other 86       ovarian/uterus, Aunt,      Current Medical providers:  Primary Care: Talea Manges,NP         Objective:    Today's Vitals   09/26/23 1524  BP: 128/72  Pulse: 69  Temp: 98 F (36.7 C)  TempSrc: Oral  SpO2: 98%  Weight: 139 lb 3.2 oz (63.1 kg)  Height: 5' 8.5" (1.74 m)  Body mass index is 20.86 kg/m.  Medications Outpatient Encounter Medications as of 09/26/2023  Medication Sig   azelastine  (ASTELIN ) 0.1 % nasal spray Place 1 spray into both nostrils 2 (two) times daily. Use in each nostril as directed   predniSONE (DELTASONE) 10 MG tablet Take 40 mg by mouth on day 1, then taper 10 mg daily until gone   amLODipine  (NORVASC ) 2.5 MG tablet Take 1 tablet (2.5 mg total) by mouth daily.   atorvastatin  (LIPITOR) 20 MG tablet Take 1 tablet (20 mg total) by mouth daily.    Cholecalciferol  (VITAMIN D3) 1000 units CAPS Take by mouth daily.   Cholecalciferol  1.25 MG (50000 UT) TABS 50,000 units PO qwk for 8 weeks.   Estradiol  (VAGIFEM ) 10 MCG TABS vaginal tablet PLACE 1 TABLET (10 MCG TOTAL) VAGINALLY ONCE A WEEK.   famotidine (PEPCID) 20 MG tablet Take 20 mg by mouth 2 (two) times daily.   levocetirizine (XYZAL ) 5 MG tablet Take 1 tablet (5 mg total) by mouth every evening.   metoprolol  succinate (TOPROL -XL) 50 MG 24 hr tablet Take with or immediately following a meal.   mometasone  (NASONEX ) 50 MCG/ACT nasal spray PLACE 2 SPRAYS INTO THE NOSE DAILY.   Multiple Vitamins-Minerals (WOMENS MULTIVITAMIN) TABS Take by mouth daily.   [DISCONTINUED] Estradiol  (VAGIFEM ) 10 MCG TABS vaginal tablet PLACE 1 TABLET (10 MCG TOTAL) VAGINALLY ONCE A WEEK.   No facility-administered encounter medications on file as of 09/26/2023.     History: Past Medical History:  Diagnosis Date   Allergy    hay fever   Chicken pox    GERD (gastroesophageal reflux disease)    Hyperlipidemia    Hypertension    Vaginal polyp 2014   benign   Past Surgical History:  Procedure Laterality Date   COLONOSCOPY WITH PROPOFOL  N/A 11/09/2020   Procedure: COLONOSCOPY WITH PROPOFOL ;  Surgeon: Selena Daily, MD;  Location: [facility] ENDOSCOPY;  Service: Gastroenterology;  Laterality: N/A;   INTRAUTERINE DEVICE INSERTION     Removed 2014, Mirena, [facility], Melody Burr   VAGINAL DELIVERY     2    Family History  Problem Relation Age of Onset   Hypertension Mother    Diabetes Maternal Grandmother    Cancer Maternal Grandmother        breast   Breast cancer Maternal Grandmother 46   Hypertension Brother    Hypertension Father    Diabetes Maternal Grandfather    Ovarian cancer Other 56       ovarian/uterus, Aunt,    Social History   Occupational History   Not on file  Tobacco Use   Smoking status: Never   Smokeless tobacco: Never  Vaping Use   Vaping status: Never Used   Substance and Sexual Activity   Alcohol use: Yes    Comment: 3-4 days per week, one glass of wine.    Drug use: Never   Sexual activity: Not on file    Tobacco Counseling Counseling given: Not Answered  Immunizations and Health Maintenance Immunization History  Administered Date(s) Administered   Fluad Trivalent(High Dose 65+) 01/07/2023   Influenza Inj Mdck Quad Pf 01/15/2019, 01/19/2021   Influenza Split 02/07/2014   Influenza Whole 01/27/2013   Influenza,inj,Quad PF,6+ Mos 04/11/2015, 02/17/2018, 02/02/2020   Influenza,inj,quad, With Preservative 02/15/2017   Influenza-Unspecified 02/17/2018   Moderna Covid-19 Vaccine Bivalent Booster 46yrs & up 01/19/2021   Moderna Sars-Covid-2 Vaccination 06/25/2019, 07/23/2019, 03/02/2020   Tdap 11/05/2011, 09/13/2022   Zoster Recombinant(Shingrix ) 04/24/2022, 08/28/2022   Zoster, Live 12/04/2013   Health Maintenance Due  Topic Date Due   Pneumonia Vaccine 79+ Years old (1 of 1 - PCV) Never done   DEXA SCAN  Never done   COVID-19 Vaccine (5 - 2024-25 season) 12/23/2022    Activities of Daily Living    09/23/2023    1:10 PM  In your present state of health, do you have any difficulty performing the following activities:  Hearing? 0  Vision? 0  Difficulty concentrating or making decisions? 0  Walking or climbing stairs? 0  Dressing or bathing? 0  Doing errands, shopping? 0  Preparing Food and eating ? N  Using the Toilet? N  In the past six months, have you accidently leaked urine? N  Do you have problems with loss of bowel control? N  Managing your Medications? N  Managing your Finances? N  Housekeeping or managing your Housekeeping? N    Physical Exam   Physical Exam Vitals reviewed.  Constitutional:      Appearance: She is well-developed.  HENT:     Head: Normocephalic and atraumatic.     Right Ear: Hearing, ear canal and external ear normal. No decreased hearing noted. No drainage, swelling or tenderness. No  middle ear effusion. No foreign body. Tympanic membrane is bulging. Tympanic membrane is not erythematous.     Left Ear: Hearing, ear canal and external ear normal. No decreased hearing noted. No drainage, swelling or tenderness.  No middle ear effusion. No foreign body. Tympanic membrane is bulging. Tympanic membrane is not erythematous.     Nose: Nose normal. No rhinorrhea.     Right Sinus: No maxillary sinus tenderness or frontal sinus tenderness.     Left Sinus: No maxillary sinus tenderness or frontal sinus tenderness.     Mouth/Throat:     Pharynx: Uvula midline. No oropharyngeal exudate or posterior oropharyngeal erythema.     Tonsils: No tonsillar abscesses.  Eyes:     General: Lids are normal. Lids are everted, no foreign bodies appreciated.     Conjunctiva/sclera: Conjunctivae normal.     Pupils: Pupils are equal, round, and reactive to light.     Comments: Normal fundus bilaterally   Cardiovascular:     Rate and Rhythm: Normal rate and regular rhythm.     Pulses: Normal pulses.     Heart sounds: Normal heart sounds.  Pulmonary:     Effort: Pulmonary effort is normal.     Breath sounds: Normal breath sounds. No wheezing, rhonchi or rales.  Lymphadenopathy:     Head:     Right side of head: No submental, submandibular, tonsillar, preauricular, posterior auricular or occipital adenopathy.     Left side of head: No submental, submandibular, tonsillar, preauricular, posterior auricular or occipital adenopathy.     Cervical: No cervical adenopathy.     Right cervical: No superficial, deep or posterior cervical adenopathy.    Left cervical: No superficial, deep or posterior cervical adenopathy.  Skin:    General: Skin is  warm and dry.  Neurological:     Mental Status: She is alert.     Cranial Nerves: No cranial nerve deficit.     Sensory: No sensory deficit.     Deep Tendon Reflexes:     Reflex Scores:      Bicep reflexes are 2+ on the right side and 2+ on the left side.       Patellar reflexes are 2+ on the right side and 2+ on the left side.    Comments: Grip equal and strong bilateral upper extremities. Gait strong and steady. Able to perform  finger-to-nose without difficulty.   Psychiatric:        Speech: Speech normal.        Behavior: Behavior normal.        Thought Content: Thought content normal.    (optional), or other factors deemed appropriate based on the beneficiary's medical and social history and current clinical standards.   Advanced Directives:    EKG:  normal EKG, normal sinus rhythm, unchanged from previous tracings      Assessment:    This is a routine wellness examination for this patient .   Vision/Hearing screen Vision Screening   Right eye Left eye Both eyes  Without correction 20/40 20/30 20/30   With correction        Goals   None    Depression Screen    08/28/2022    9:08 AM 04/10/2022   12:39 PM 08/14/2021    9:04 AM 02/20/2017   10:38 AM  PHQ 2/9 Scores  PHQ - 2 Score 0 0 0 0  PHQ- 9 Score 0        Fall Risk    09/23/2023    1:10 PM  Fall Risk   Falls in the past year? 0    Cognitive Function:        09/26/2023    3:19 PM  6CIT Screen  What Year? 0 points  What month? 0 points  What time? 0 points  Count back from 20 0 points  Months in reverse 0 points  Repeat phrase 0 points  Total Score 0 points    Patient Care Team: Calista Catching, FNP as PCP - General (Family Medicine)     Plan:    Welcome to Medicare preventive visit Assessment & Plan:   Medications reviewed, patient not on opioids.   Baseline EKG obtained; sinus bradycardia ; t wave inversions without changes when compared to prior 12/18/2017 Screenings administered: Fall screening, depression scale ( PHQ9) , anxiety scale ( GAD 7 ), vision screening,6CIT,    No cognitive impairment assessed by observation. Assessed risk of fall, depression. Counseled on importance of end of life planning; she has her  healthcare power of  attorney and living will completed.  Patient encouraged to bring this to office so we can add to chart.     Encouraged continued exercise.   Counseled on fall prevention, nutrition, physical activity, tobacco use cessation, weight loss, cognition.     Orders: -     EKG 12-Lead -     Hemoglobin A1c -     Comprehensive metabolic panel with GFR -     CBC with Differential/Platelet -     Lipid panel -     TSH -     VITAMIN D  25 Hydroxy (Vit-D Deficiency, Fractures)  Encounter for Medicare annual wellness exam  Hypertension, unspecified type Assessment & Plan: Chronic, stable.  Continue amlodipine  2.5 mg daily ,  metoprolol  50 mg daily.    Orders: -     Comprehensive metabolic panel with GFR -     CBC with Differential/Platelet -     TSH  Encounter for lipid screening for cardiovascular disease -     Lipid panel  Vitamin D  deficiency -     VITAMIN D  25 Hydroxy (Vit-D Deficiency, Fractures)  Asymptomatic postmenopausal state -     DG Bone Density; Future  Atrophic vaginitis -     Estradiol ; PLACE 1 TABLET (10 MCG TOTAL) VAGINALLY ONCE A WEEK.  Dispense: 12 tablet; Refill: 3  Decreased taste and smell Assessment & Plan: Decreased taste and smell after URI without neurologic symptoms.  Reassuring neurologic exam today.  Discussed bilateral otitis media, and/or eustachian tube dysfunction persistent after URI.  Continue Xyzal .  Start azelastine .  Provided prednisone taper after azelastine  if azelastine  is not effective.  Discussed olfactory retraining .  if altered taste and smell does not resolve, advised ENT consult, MRI brain.  She verbalized understanding will let me know how she is doing  Orders: -     Azelastine  HCl; Place 1 spray into both nostrils 2 (two) times daily. Use in each nostril as directed  Dispense: 30 mL; Refill: 4 -     predniSONE; Take 40 mg by mouth on day 1, then taper 10 mg daily until gone  Dispense: 10 tablet; Refill: 0    I have personally reviewed  and noted the following in the patient's chart:   Medical and social history Use of alcohol, tobacco or illicit drugs  Current medications and supplements including opioid prescriptions. Patient is not currently taking opioid prescriptions. Functional ability and status Nutritional status Physical activity Advanced directives List of other physicians Hospitalizations, surgeries, and ER visits in previous 12 months Vitals Screenings to include cognitive, depression, and falls Referrals and appointments  In addition, I have reviewed and discussed with patient certain preventive protocols, quality metrics, and best practice recommendations. A written personalized care plan for preventive services as well as general preventive health recommendations were provided to patient.     Bascom Bossier, FNP 09/26/2023

## 2023-09-26 NOTE — Patient Instructions (Addendum)
 Please call  and schedule  bone density scan as we discussed.   [facility]  ( new location in 2023)  [address], [place], [place] [address]  [place], [place]  [phone]   Start azelastine .  If symptoms do not resolve then start prednisone.  Please let me know how you are doing  Health Maintenance for Postmenopausal Women Menopause is a normal process in which your ability to get pregnant comes to an end. This process happens slowly over many months or years, usually between the ages of 67 and 41. Menopause is complete when you have missed your menstrual period for 12 months. It is important to talk with your health care provider about some of the most common conditions that affect women after menopause (postmenopausal women). These include heart disease, cancer, and bone loss (osteoporosis). Adopting a healthy lifestyle and getting preventive care can help to promote your health and wellness. The actions you take can also lower your chances of developing some of these common conditions. What are the signs and symptoms of menopause? During menopause, you may have the following symptoms: Hot flashes. These can be moderate or severe. Night sweats. Decrease in sex drive. Mood swings. Headaches. Tiredness (fatigue). Irritability. Memory problems. Problems falling asleep or staying asleep. Talk with your health care provider about treatment options for your symptoms. Do I need hormone replacement therapy? Hormone replacement therapy is effective in treating symptoms that are caused by menopause, such as hot flashes and night sweats. Hormone replacement carries certain risks, especially as you become older. If you are thinking about using estrogen or estrogen with progestin, discuss the benefits and risks with your health care provider. How can I reduce my risk for heart disease and stroke? The risk of heart disease, heart attack, and stroke increases as you age.  One of the causes may be a change in the body's hormones during menopause. This can affect how your body uses dietary fats, triglycerides, and cholesterol. Heart attack and stroke are medical emergencies. There are many things that you can do to help prevent heart disease and stroke. Watch your blood pressure High blood pressure causes heart disease and increases the risk of stroke. This is more likely to develop in people who have high blood pressure readings or are overweight. Have your blood pressure checked: Every 3-5 years if you are 64-66 years of age. Every year if you are 86 years old or older. Eat a healthy diet  Eat a diet that includes plenty of vegetables, fruits, low-fat dairy products, and lean protein. Do not eat a lot of foods that are high in solid fats, added sugars, or sodium. Get regular exercise Get regular exercise. This is one of the most important things you can do for your health. Most adults should: Try to exercise for at least 150 minutes each week. The exercise should increase your heart rate and make you sweat (moderate-intensity exercise). Try to do strengthening exercises at least twice each week. Do these in addition to the moderate-intensity exercise. Spend less time sitting. Even light physical activity can be beneficial. Other tips Work with your health care provider to achieve or maintain a healthy weight. Do not use any products that contain nicotine or tobacco. These products include cigarettes, chewing tobacco, and vaping devices, such as e-cigarettes. If you need help quitting, ask your health care provider. Know your numbers. Ask your health care provider to check your cholesterol and your blood sugar (glucose). Continue to have  your blood tested as directed by your health care provider. Do I need screening for cancer? Depending on your health history and family history, you may need to have cancer screenings at different stages of your life. This may  include screening for: Breast cancer. Cervical cancer. Lung cancer. Colorectal cancer. What is my risk for osteoporosis? After menopause, you may be at increased risk for osteoporosis. Osteoporosis is a condition in which bone destruction happens more quickly than new bone creation. To help prevent osteoporosis or the bone fractures that can happen because of osteoporosis, you may take the following actions: If you are 8-34 years old, get at least 1,000 mg of calcium  and at least 600 international units (IU) of vitamin D  per day. If you are older than age 75 but younger than age 16, get at least 1,200 mg of calcium  and at least 600 international units (IU) of vitamin D  per day. If you are older than age 54, get at least 1,200 mg of calcium  and at least 800 international units (IU) of vitamin D  per day. Smoking and drinking excessive alcohol increase the risk of osteoporosis. Eat foods that are rich in calcium  and vitamin D , and do weight-bearing exercises several times each week as directed by your health care provider. How does menopause affect my mental health? Depression may occur at any age, but it is more common as you become older. Common symptoms of depression include: Feeling depressed. Changes in sleep patterns. Changes in appetite or eating patterns. Feeling an overall lack of motivation or enjoyment of activities that you previously enjoyed. Frequent crying spells. Talk with your health care provider if you think that you are experiencing any of these symptoms. General instructions See your health care provider for regular wellness exams and vaccines. This may include: Scheduling regular health, dental, and eye exams. Getting and maintaining your vaccines. These include: Influenza vaccine. Get this vaccine each year before the flu season begins. Pneumonia vaccine. Shingles vaccine. Tetanus, diphtheria, and pertussis (Tdap) booster vaccine. Your health care provider may also  recommend other immunizations. Tell your health care provider if you have ever been abused or do not feel safe at home. Summary Menopause is a normal process in which your ability to get pregnant comes to an end. This condition causes hot flashes, night sweats, decreased interest in sex, mood swings, headaches, or lack of sleep. Treatment for this condition may include hormone replacement therapy. Take actions to keep yourself healthy, including exercising regularly, eating a healthy diet, watching your weight, and checking your blood pressure and blood sugar levels. Get screened for cancer and depression. Make sure that you are up to date with all your vaccines. This information is not intended to replace advice given to you by your health care provider. Make sure you discuss any questions you have with your health care provider. Document Revised: 08/29/2020 Document Reviewed: 08/29/2020 Elsevier Patient Education  2024 [employer].

## 2023-09-26 NOTE — Assessment & Plan Note (Signed)
Chronic, stable.  Continue amlodipine 2.5 mg daily , metoprolol 50 mg daily.

## 2023-09-27 NOTE — Addendum Note (Signed)
 Addended by: Lindle Rhea on: 09/27/2023 03:53 PM   Modules accepted: Orders

## 2023-09-30 NOTE — Addendum Note (Signed)
 Addended by: Lindle Rhea on: 09/30/2023 04:32 PM   Modules accepted: Orders

## 2023-10-01 ENCOUNTER — Other Ambulatory Visit (INDEPENDENT_AMBULATORY_CARE_PROVIDER_SITE_OTHER)

## 2023-10-01 DIAGNOSIS — Z136 Encounter for screening for cardiovascular disorders: Secondary | ICD-10-CM | POA: Diagnosis not present

## 2023-10-01 DIAGNOSIS — Z0000 Encounter for general adult medical examination without abnormal findings: Secondary | ICD-10-CM

## 2023-10-01 DIAGNOSIS — E559 Vitamin D deficiency, unspecified: Secondary | ICD-10-CM

## 2023-10-01 DIAGNOSIS — I10 Essential (primary) hypertension: Secondary | ICD-10-CM | POA: Diagnosis not present

## 2023-10-01 DIAGNOSIS — Z13220 Encounter for screening for lipoid disorders: Secondary | ICD-10-CM

## 2023-10-02 LAB — COMPREHENSIVE METABOLIC PANEL WITH GFR
AG Ratio: 1.5 (calc) (ref 1.0–2.5)
ALT: 21 U/L (ref 6–29)
AST: 24 U/L (ref 10–35)
Albumin: 4.3 g/dL (ref 3.6–5.1)
Alkaline phosphatase (APISO): 61 U/L (ref 37–153)
BUN: 17 mg/dL (ref 7–25)
CO2: 27 mmol/L (ref 20–32)
Calcium: 9.3 mg/dL (ref 8.6–10.4)
Chloride: 102 mmol/L (ref 98–110)
Creat: 0.8 mg/dL (ref 0.50–1.05)
Globulin: 2.8 g/dL (ref 1.9–3.7)
Glucose, Bld: 94 mg/dL (ref 65–99)
Potassium: 4.5 mmol/L (ref 3.5–5.3)
Sodium: 136 mmol/L (ref 135–146)
Total Bilirubin: 1 mg/dL (ref 0.2–1.2)
Total Protein: 7.1 g/dL (ref 6.1–8.1)
eGFR: 82 mL/min/{1.73_m2} (ref >=60)

## 2023-10-02 LAB — CBC WITH DIFFERENTIAL/PLATELET
Absolute Lymphocytes: 4358 {cells}/uL — ABNORMAL HIGH (ref 850–3900)
Absolute Monocytes: 439 {cells}/uL (ref 200–950)
Basophils Absolute: 31 {cells}/uL (ref 0–200)
Basophils Relative: 0.4 %
Eosinophils Absolute: 108 {cells}/uL (ref 15–500)
Eosinophils Relative: 1.4 %
HCT: 39.2 % (ref 35.0–45.0)
Hemoglobin: 12.9 g/dL (ref 11.7–15.5)
MCH: 30.1 pg (ref 27.0–33.0)
MCHC: 32.9 g/dL (ref 32.0–36.0)
MCV: 91.6 fL (ref 80.0–100.0)
MPV: 9.6 fL (ref 7.5–12.5)
Monocytes Relative: 5.7 %
Neutro Abs: 2764 {cells}/uL (ref 1500–7800)
Neutrophils Relative %: 35.9 %
Platelets: 172 10*3/uL (ref 140–400)
RBC: 4.28 10*6/uL (ref 3.80–5.10)
RDW: 14 % (ref 11.0–15.0)
Total Lymphocyte: 56.6 %
WBC: 7.7 10*3/uL (ref 3.8–10.8)

## 2023-10-02 LAB — HEMOGLOBIN A1C
Hgb A1c MFr Bld: 5.8 % — ABNORMAL HIGH (ref <5.7)
Mean Plasma Glucose: 120 mg/dL
eAG (mmol/L): 6.6 mmol/L

## 2023-10-02 LAB — LIPID PANEL
Cholesterol: 173 mg/dL (ref <200)
HDL: 70 mg/dL (ref >=50)
LDL Cholesterol (Calc): 85 mg/dL
Non-HDL Cholesterol (Calc): 103 mg/dL (ref <130)
Total CHOL/HDL Ratio: 2.5 (calc) (ref <5.0)
Triglycerides: 89 mg/dL (ref <150)

## 2023-10-02 LAB — VITAMIN D 25 HYDROXY (VIT D DEFICIENCY, FRACTURES): Vit D, 25-Hydroxy: 50 ng/mL (ref 30–100)

## 2023-10-02 LAB — TSH: TSH: 1.21 m[IU]/L (ref 0.40–4.50)

## 2023-10-07 ENCOUNTER — Ambulatory Visit: Payer: Self-pay | Admitting: Family

## 2023-10-07 DIAGNOSIS — I10 Essential (primary) hypertension: Secondary | ICD-10-CM

## 2023-10-07 NOTE — Addendum Note (Signed)
 Addended by: Zaydenn Balaguer on: 10/07/2023 04:55 PM   Modules accepted: Orders

## 2023-10-07 NOTE — Telephone Encounter (Signed)
 LVM to call back to office,I have Ordered CBC with differential and  pt needs to schedule lab appt in 1 to 2 weeks

## 2023-10-24 ENCOUNTER — Other Ambulatory Visit (INDEPENDENT_AMBULATORY_CARE_PROVIDER_SITE_OTHER)

## 2023-10-24 DIAGNOSIS — I10 Essential (primary) hypertension: Secondary | ICD-10-CM

## 2023-10-24 LAB — CBC WITH DIFFERENTIAL/PLATELET
Basophils Absolute: 0 10*3/uL (ref 0.0–0.1)
Basophils Relative: 0.6 % (ref 0.0–3.0)
Eosinophils Absolute: 0.1 10*3/uL (ref 0.0–0.7)
Eosinophils Relative: 1.8 % (ref 0.0–5.0)
HCT: 37.6 % (ref 36.0–46.0)
Hemoglobin: 12.7 g/dL (ref 12.0–15.0)
Lymphocytes Relative: 51.1 % — ABNORMAL HIGH (ref 12.0–46.0)
Lymphs Abs: 3.3 10*3/uL (ref 0.7–4.0)
MCHC: 33.9 g/dL (ref 30.0–36.0)
MCV: 87.4 fl (ref 78.0–100.0)
Monocytes Absolute: 0.5 10*3/uL (ref 0.1–1.0)
Monocytes Relative: 7.2 % (ref 3.0–12.0)
Neutro Abs: 2.5 10*3/uL (ref 1.4–7.7)
Neutrophils Relative %: 39.3 % — ABNORMAL LOW (ref 43.0–77.0)
Platelets: 169 10*3/uL (ref 150.0–400.0)
RBC: 4.3 Mil/uL (ref 3.87–5.11)
RDW: 14.8 % (ref 11.5–15.5)
WBC: 6.4 10*3/uL (ref 4.0–10.5)

## 2023-10-28 ENCOUNTER — Ambulatory Visit: Payer: Self-pay | Admitting: Family

## 2023-10-28 DIAGNOSIS — I10 Essential (primary) hypertension: Secondary | ICD-10-CM

## 2023-11-07 ENCOUNTER — Encounter: Payer: Self-pay | Admitting: Family

## 2023-11-08 ENCOUNTER — Other Ambulatory Visit: Payer: Self-pay | Admitting: Family

## 2023-11-08 DIAGNOSIS — N952 Postmenopausal atrophic vaginitis: Secondary | ICD-10-CM

## 2023-11-08 MED ORDER — ESTRADIOL 10 MCG VA TABS: ORAL_TABLET | VAGINAL | 3 refills | Status: AC

## 2023-12-25 ENCOUNTER — Other Ambulatory Visit (INDEPENDENT_AMBULATORY_CARE_PROVIDER_SITE_OTHER)

## 2023-12-25 DIAGNOSIS — I10 Essential (primary) hypertension: Secondary | ICD-10-CM | POA: Diagnosis not present

## 2023-12-25 LAB — CBC WITH DIFFERENTIAL/PLATELET
Basophils Absolute: 0 K/uL (ref 0.0–0.1)
Basophils Relative: 0.5 % (ref 0.0–3.0)
Eosinophils Absolute: 0.1 K/uL (ref 0.0–0.7)
Eosinophils Relative: 1.4 % (ref 0.0–5.0)
HCT: 38.8 % (ref 36.0–46.0)
Hemoglobin: 12.9 g/dL (ref 12.0–15.0)
Lymphocytes Relative: 57.1 % — ABNORMAL HIGH (ref 12.0–46.0)
Lymphs Abs: 4.6 K/uL — ABNORMAL HIGH (ref 0.7–4.0)
MCHC: 33.4 g/dL (ref 30.0–36.0)
MCV: 89 fl (ref 78.0–100.0)
Monocytes Absolute: 0.4 K/uL (ref 0.1–1.0)
Monocytes Relative: 5 % (ref 3.0–12.0)
Neutro Abs: 2.9 K/uL (ref 1.4–7.7)
Neutrophils Relative %: 36 % — ABNORMAL LOW (ref 43.0–77.0)
Platelets: 190 K/uL (ref 150.0–400.0)
RBC: 4.36 Mil/uL (ref 3.87–5.11)
RDW: 13.8 % (ref 11.5–15.5)
WBC: 8.1 K/uL (ref 4.0–10.5)

## 2023-12-30 ENCOUNTER — Other Ambulatory Visit: Payer: Self-pay

## 2023-12-30 ENCOUNTER — Ambulatory Visit: Payer: Self-pay | Admitting: Family

## 2023-12-30 DIAGNOSIS — D72820 Lymphocytosis (symptomatic): Secondary | ICD-10-CM

## 2024-01-13 ENCOUNTER — Encounter: Admitting: Internal Medicine

## 2024-01-13 ENCOUNTER — Other Ambulatory Visit

## 2024-02-24 ENCOUNTER — Inpatient Hospital Stay: Attending: Internal Medicine | Admitting: Internal Medicine

## 2024-02-24 ENCOUNTER — Inpatient Hospital Stay

## 2024-02-24 ENCOUNTER — Encounter: Payer: Self-pay | Admitting: Internal Medicine

## 2024-02-24 VITALS — BP 128/86 | HR 61 | Temp 98.0°F | Resp 16 | Ht 68.5 in | Wt 141.6 lb

## 2024-02-24 DIAGNOSIS — D72820 Lymphocytosis (symptomatic): Secondary | ICD-10-CM | POA: Diagnosis not present

## 2024-02-24 DIAGNOSIS — Z803 Family history of malignant neoplasm of breast: Secondary | ICD-10-CM | POA: Insufficient documentation

## 2024-02-24 DIAGNOSIS — Z8041 Family history of malignant neoplasm of ovary: Secondary | ICD-10-CM | POA: Diagnosis not present

## 2024-02-24 DIAGNOSIS — C9110 Chronic lymphocytic leukemia of B-cell type not having achieved remission: Secondary | ICD-10-CM | POA: Insufficient documentation

## 2024-02-24 LAB — CBC WITH DIFFERENTIAL/PLATELET
Abs Immature Granulocytes: 0.02 K/uL (ref 0.00–0.07)
Basophils Absolute: 0.1 K/uL (ref 0.0–0.1)
Basophils Relative: 1 %
Eosinophils Absolute: 0.1 K/uL (ref 0.0–0.5)
Eosinophils Relative: 1 %
HCT: 40 % (ref 36.0–46.0)
Hemoglobin: 13.6 g/dL (ref 12.0–15.0)
Immature Granulocytes: 0 %
Lymphocytes Relative: 61 %
Lymphs Abs: 5.6 K/uL — ABNORMAL HIGH (ref 0.7–4.0)
MCH: 29.7 pg (ref 26.0–34.0)
MCHC: 34 g/dL (ref 30.0–36.0)
MCV: 87.3 fL (ref 80.0–100.0)
Monocytes Absolute: 0.4 K/uL (ref 0.1–1.0)
Monocytes Relative: 4 %
Neutro Abs: 3.1 K/uL (ref 1.7–7.7)
Neutrophils Relative %: 33 %
Platelets: 180 K/uL (ref 150–400)
RBC: 4.58 MIL/uL (ref 3.87–5.11)
RDW: 13.2 % (ref 11.5–15.5)
Smear Review: NORMAL
WBC: 9.3 K/uL (ref 4.0–10.5)
nRBC: 0 % (ref 0.0–0.2)

## 2024-02-24 LAB — LACTATE DEHYDROGENASE: LDH: 167 U/L (ref 98–192)

## 2024-02-24 NOTE — Progress Notes (Signed)
 [facility] OFFICE PROGRESS NOTE  Patient Care Team: Dineen Rollene MATSU, FNP as PCP - General (Family Medicine) Rennie Cindy SAUNDERS, MD as Consulting Physician (Oncology)   # HEMATOLOGY HISTORY:  # LEUCOCYTOSIS- WBC-; N; L; Hb- platelets   Oncology History   No history exists.      INTERVAL HISTORY:  Natasha Pace 68 y.o.  female pleasant patient no prior history of any malignancies has been referred to us  for lymphocytosis.  Patient is completely asymptomatic from her underlying lymphocytosis.   Night sweats: none  Weight loss:none  Early satiety:none   Infections:NONE Splenectomy: NONE Steroids: NONE Smoke: none     Review of Systems  Constitutional:  Negative for chills, diaphoresis, fever, malaise/fatigue and weight loss.  HENT:  Negative for nosebleeds and sore throat.   Eyes:  Negative for double vision.  Respiratory:  Negative for cough, hemoptysis, sputum production, shortness of breath and wheezing.   Cardiovascular:  Negative for chest pain, palpitations, orthopnea and leg swelling.  Gastrointestinal:  Negative for abdominal pain, blood in stool, constipation, diarrhea, heartburn, melena, nausea and vomiting.  Genitourinary:  Negative for dysuria, frequency and urgency.  Musculoskeletal:  Negative for back pain and joint pain.  Skin: Negative.  Negative for itching and rash.  Neurological:  Negative for dizziness, tingling, focal weakness, weakness and headaches.  Endo/Heme/Allergies:  Does not bruise/bleed easily.  Psychiatric/Behavioral:  Negative for depression. The patient is not nervous/anxious and does not have insomnia.       PAST MEDICAL HISTORY :  Past Medical History:  Diagnosis Date   Allergy    hay fever   Chicken pox    GERD (gastroesophageal reflux disease)    Hyperlipidemia    Hypertension    Vaginal polyp 2014   benign    PAST SURGICAL HISTORY :   Past Surgical History:  Procedure Laterality Date   COLONOSCOPY  WITH PROPOFOL  N/A 11/09/2020   Procedure: COLONOSCOPY WITH PROPOFOL ;  Surgeon: Unk Corinn Skiff, MD;  Location: [facility] ENDOSCOPY;  Service: Gastroenterology;  Laterality: N/A;   INTRAUTERINE DEVICE INSERTION     Removed 2014, Mirena, [facility], [facility] [employer]   VAGINAL DELIVERY     2    FAMILY HISTORY :   Family History  Problem Relation Age of Onset   Hypertension Mother    Diabetes Maternal Grandmother    Cancer Maternal Grandmother        breast   Breast cancer Maternal Grandmother 49   Hypertension Brother    Hypertension Father    Diabetes Maternal Grandfather    Ovarian cancer Other 45       ovarian/uterus, Aunt,     SOCIAL HISTORY:   Social History   Tobacco Use   Smoking status: Never   Smokeless tobacco: Never  Vaping Use   Vaping status: Never Used  Substance Use Topics   Alcohol use: Yes    Comment: 3-4 days per week, one glass of wine.    Drug use: Never    ALLERGIES:  has no known allergies.  MEDICATIONS:  Current Outpatient Medications  Medication Sig Dispense Refill   amLODipine  (NORVASC ) 2.5 MG tablet Take 1 tablet (2.5 mg total) by mouth daily. 90 tablet 3   atorvastatin  (LIPITOR) 20 MG tablet Take 1 tablet (20 mg total) by mouth daily. 90 tablet 3   azelastine  (ASTELIN ) 0.1 % nasal spray Place 1 spray into both nostrils 2 (two) times daily. Use in each nostril as directed 30 mL 4  Cholecalciferol  (VITAMIN D3) 1000 units CAPS Take by mouth daily.     Estradiol  (VAGIFEM ) 10 MCG TABS vaginal tablet PLACE 1 TABLET (10 MCG TOTAL) VAGINALLY TWICE A WEEK. 24 tablet 3   famotidine (PEPCID) 20 MG tablet Take 20 mg by mouth 2 (two) times daily.     levocetirizine (XYZAL ) 5 MG tablet Take 1 tablet (5 mg total) by mouth every evening. 3 tablet 2   metoprolol  succinate (TOPROL -XL) 50 MG 24 hr tablet Take with or immediately following a meal. 90 tablet 3   Multiple Vitamins-Minerals (WOMENS MULTIVITAMIN) TABS Take by mouth daily.     No current  facility-administered medications for this visit.    PHYSICAL EXAMINATION:  BP 128/86 (BP Location: Left Arm, Patient Position: Sitting, Cuff Size: Normal)   Pulse 61   Temp 98 F (36.7 C) (Tympanic)   Resp 16   Ht 5' 8.5 (1.74 m)   Wt 141 lb 9.6 oz (64.2 kg)   SpO2 100%   BMI 21.22 kg/m   Filed Weights   02/24/24 1105  Weight: 141 lb 9.6 oz (64.2 kg)    Physical Exam Vitals and nursing note reviewed.  HENT:     Head: Normocephalic and atraumatic.     Mouth/Throat:     Pharynx: Oropharynx is clear.  Eyes:     Extraocular Movements: Extraocular movements intact.     Pupils: Pupils are equal, round, and reactive to light.  Cardiovascular:     Rate and Rhythm: Normal rate and regular rhythm.  Pulmonary:     Effort: Pulmonary effort is normal.     Breath sounds: Normal breath sounds.  Abdominal:     Palpations: Abdomen is soft.  Musculoskeletal:        General: Normal range of motion.     Cervical back: Normal range of motion.  Skin:    General: Skin is warm.  Neurological:     General: No focal deficit present.     Mental Status: She is alert and oriented to person, place, and time.  Psychiatric:        Behavior: Behavior normal.        Judgment: Judgment normal.        LABORATORY DATA:  I have reviewed the data as listed    Component Value Date/Time   NA 136 10/01/2023 1035   K 4.5 10/01/2023 1035   CL 102 10/01/2023 1035   CO2 27 10/01/2023 1035   GLUCOSE 94 10/01/2023 1035   BUN 17 10/01/2023 1035   CREATININE 0.80 10/01/2023 1035   CALCIUM  9.3 10/01/2023 1035   PROT 7.1 10/01/2023 1035   ALBUMIN 4.4 08/28/2022 0951   AST 24 10/01/2023 1035   ALT 21 10/01/2023 1035   ALKPHOS 65 08/28/2022 0951   BILITOT 1.0 10/01/2023 1035    No results found for: SPEP, UPEP  Lab Results  Component Value Date   WBC 8.1 12/25/2023   NEUTROABS 2.9 12/25/2023   HGB 12.9 12/25/2023   HCT 38.8 12/25/2023   MCV 89.0 12/25/2023   PLT 190.0 12/25/2023       Chemistry      Component Value Date/Time   NA 136 10/01/2023 1035   K 4.5 10/01/2023 1035   CL 102 10/01/2023 1035   CO2 27 10/01/2023 1035   BUN 17 10/01/2023 1035   CREATININE 0.80 10/01/2023 1035      Component Value Date/Time   CALCIUM  9.3 10/01/2023 1035   ALKPHOS 65 08/28/2022 0951   AST  24 10/01/2023 1035   ALT 21 10/01/2023 1035   BILITOT 1.0 10/01/2023 1035       RADIOGRAPHIC STUDIES: I have personally reviewed the radiological images as listed and agreed with the findings in the report. No results found.   ASSESSMENT & PLAN:  Lymphocytosis #Normal white count/lymphocytosis, however- SEP 2025- ALC- 4.6. Normal Hb/normal platelets-  Patient fairly asymptomatic from underlying blood work.  # I  had a long discussion with the patient regarding the multiple etiologies of elevated white count/lymphocytosis including but not limited to reactive causes like infection; inflammation.  However other etiologies include malignancy/leukemia etc   # Given rising lymphocytosis over the last 1 year-and lack of any other symptoms I discussed with the patient  that-lymphocytosis is likely suggestive of a low-grade lymphoma/leukemia; less likely reactive.  Recommend CBC CMP LDH; peripheral blood flow cytometry.  Discussed the possible need for a bone marrow biopsy for further evaluation.  However hold off a bone marrow biopsy at this time.  Hold off any imaging at this time.  # Discussed that if patient is diagnosed with low-grade lymphoma/leukemia-especially asymptomatic/with normal hemoglobin/platelets-surveillance would be the choice of management.  However await above work-up for further recommendations and plan.  Thank you Ms. Arnett FNP for allowing me to participate in the care of your pleasant patient. Please do not hesitate to contact me with questions or concerns in the interim.  # DISPOSITION: # Labs today- CBC; also peripheral blood flow cytometry; LDH;- # Follow-up in  the next week- -MD no labs- Dr.B   Orders Placed This Encounter  Procedures   Flow cytometry panel-leukemia/lymphoma work-up    Standing Status:   Future    Expiration Date:   02/23/2025   CBC with Differential/Platelet    Standing Status:   Future    Expiration Date:   02/23/2025   Lactate dehydrogenase    Standing Status:   Future    Expiration Date:   02/23/2025   All questions were answered. The patient knows to call the clinic with any problems, questions or concerns.      Cindy JONELLE Joe, MD 02/24/2024 12:12 PM

## 2024-02-24 NOTE — Progress Notes (Signed)
No questions at this time

## 2024-02-24 NOTE — Assessment & Plan Note (Addendum)
#  Normal white count/lymphocytosis, however- SEP 2025- ALC- 4.6. Normal Hb/normal platelets-  Patient fairly asymptomatic from underlying blood work.  # I  had a long discussion with the patient regarding the multiple etiologies of elevated white count/lymphocytosis including but not limited to reactive causes like infection; inflammation.  However other etiologies include malignancy/leukemia etc   # Given rising lymphocytosis over the last 1 year-and lack of any other symptoms I discussed with the patient  that-lymphocytosis is likely suggestive of a low-grade lymphoma/leukemia; less likely reactive.  Recommend CBC CMP LDH; peripheral blood flow cytometry.  Discussed the possible need for a bone marrow biopsy for further evaluation.  However hold off a bone marrow biopsy at this time.  Hold off any imaging at this time.  # Discussed that if patient is diagnosed with low-grade lymphoma/leukemia-especially asymptomatic/with normal hemoglobin/platelets-surveillance would be the choice of management.  However await above work-up for further recommendations and plan.  Thank you Ms. Arnett FNP for allowing me to participate in the care of your pleasant patient. Please do not hesitate to contact me with questions or concerns in the interim.  # DISPOSITION: # Labs today- CBC; also peripheral blood flow cytometry; LDH;- # Follow-up in the next week- -MD no labs- Dr.B

## 2024-02-27 LAB — COMP PANEL: LEUKEMIA/LYMPHOMA: Immunophenotypic Profile: 40

## 2024-03-02 ENCOUNTER — Inpatient Hospital Stay: Admitting: Internal Medicine

## 2024-03-02 ENCOUNTER — Encounter: Payer: Self-pay | Admitting: Internal Medicine

## 2024-03-02 ENCOUNTER — Encounter: Payer: Self-pay | Admitting: Family

## 2024-03-02 VITALS — BP 128/87 | HR 64 | Temp 97.6°F | Resp 16 | Ht 68.5 in | Wt 138.8 lb

## 2024-03-02 DIAGNOSIS — Z8041 Family history of malignant neoplasm of ovary: Secondary | ICD-10-CM | POA: Diagnosis not present

## 2024-03-02 DIAGNOSIS — Z803 Family history of malignant neoplasm of breast: Secondary | ICD-10-CM | POA: Diagnosis not present

## 2024-03-02 DIAGNOSIS — D72820 Lymphocytosis (symptomatic): Secondary | ICD-10-CM

## 2024-03-02 NOTE — Progress Notes (Signed)
 Patient states no concerns at this time. Labs recently preformed on 02/24/24.

## 2024-03-02 NOTE — Progress Notes (Signed)
 [facility] OFFICE PROGRESS NOTE  Patient Care Team: Dineen Rollene MATSU, FNP as PCP - General (Family Medicine) Rennie Cindy SAUNDERS, MD as Consulting Physician (Oncology)   # HEMATOLOGY HISTORY:  # LEUCOCYTOSIS- WBC-; N; L; Hb- platelets   Oncology History   No history exists.      INTERVAL HISTORY:  Discussed the use of AI scribe software for clinical note transcription with the patient, who gave verbal consent to proceed.  History of Present Illness   Natasha Pace is a 68 year old female who presents with slightly elevated lymphocytes.  She has slightly elevated lymphocytes identified during routine blood work. Her complete blood count (CBC) showed a normal white blood cell count of 9.3, normal hemoglobin, and normal platelet levels. However, her lymphocyte count was elevated at 5.6, where the normal level is less than 4.  Further testing with flow cytometry revealed a CD5, CD2 positive monoclonal B cell population.      Patient is completely asymptomatic from her underlying lymphocytosis.   Review of Systems  Constitutional:  Negative for chills, diaphoresis, fever, malaise/fatigue and weight loss.  HENT:  Negative for nosebleeds and sore throat.   Eyes:  Negative for double vision.  Respiratory:  Negative for cough, hemoptysis, sputum production, shortness of breath and wheezing.   Cardiovascular:  Negative for chest pain, palpitations, orthopnea and leg swelling.  Gastrointestinal:  Negative for abdominal pain, blood in stool, constipation, diarrhea, heartburn, melena, nausea and vomiting.  Genitourinary:  Negative for dysuria, frequency and urgency.  Musculoskeletal:  Negative for back pain and joint pain.  Skin: Negative.  Negative for itching and rash.  Neurological:  Negative for dizziness, tingling, focal weakness, weakness and headaches.  Endo/Heme/Allergies:  Does not bruise/bleed easily.  Psychiatric/Behavioral:  Negative for depression. The  patient is not nervous/anxious and does not have insomnia.       PAST MEDICAL HISTORY :  Past Medical History:  Diagnosis Date   Allergy    hay fever   Chicken pox    GERD (gastroesophageal reflux disease)    Hyperlipidemia    Hypertension    Vaginal polyp 2014   benign    PAST SURGICAL HISTORY :   Past Surgical History:  Procedure Laterality Date   COLONOSCOPY WITH PROPOFOL  N/A 11/09/2020   Procedure: COLONOSCOPY WITH PROPOFOL ;  Surgeon: Unk Corinn Skiff, MD;  Location: [facility] ENDOSCOPY;  Service: Gastroenterology;  Laterality: N/A;   INTRAUTERINE DEVICE INSERTION     Removed 2014, Mirena, [facility], [facility] [employer]   VAGINAL DELIVERY     2    FAMILY HISTORY :   Family History  Problem Relation Age of Onset   Hypertension Mother    Diabetes Maternal Grandmother    Cancer Maternal Grandmother        breast   Breast cancer Maternal Grandmother 4   Hypertension Brother    Hypertension Father    Diabetes Maternal Grandfather    Ovarian cancer Other 85       ovarian/uterus, Aunt,     SOCIAL HISTORY:   Social History   Tobacco Use   Smoking status: Never   Smokeless tobacco: Never  Vaping Use   Vaping status: Never Used  Substance Use Topics   Alcohol use: Yes    Comment: 3-4 days per week, one glass of wine.    Drug use: Never    ALLERGIES:  has no known allergies.  MEDICATIONS:  Current Outpatient Medications  Medication Sig Dispense Refill  amLODipine  (NORVASC ) 2.5 MG tablet Take 1 tablet (2.5 mg total) by mouth daily. 90 tablet 3   atorvastatin  (LIPITOR) 20 MG tablet Take 1 tablet (20 mg total) by mouth daily. 90 tablet 3   azelastine  (ASTELIN ) 0.1 % nasal spray Place 1 spray into both nostrils 2 (two) times daily. Use in each nostril as directed 30 mL 4   Cholecalciferol  (VITAMIN D3) 1000 units CAPS Take by mouth daily.     Estradiol  (VAGIFEM ) 10 MCG TABS vaginal tablet PLACE 1 TABLET (10 MCG TOTAL) VAGINALLY TWICE A WEEK. 24 tablet 3    famotidine (PEPCID) 20 MG tablet Take 20 mg by mouth 2 (two) times daily.     levocetirizine (XYZAL ) 5 MG tablet Take 1 tablet (5 mg total) by mouth every evening. 3 tablet 2   metoprolol  succinate (TOPROL -XL) 50 MG 24 hr tablet Take with or immediately following a meal. 90 tablet 3   Multiple Vitamins-Minerals (WOMENS MULTIVITAMIN) TABS Take by mouth daily.     No current facility-administered medications for this visit.    PHYSICAL EXAMINATION:  BP 128/87 (BP Location: Right Arm, Patient Position: Sitting)   Pulse 64   Temp 97.6 F (36.4 C) (Tympanic)   Resp 16   Ht 5' 8.5 (1.74 m)   Wt 138 lb 12.8 oz (63 kg)   SpO2 100%   BMI 20.80 kg/m   Filed Weights   03/02/24 0831  Weight: 138 lb 12.8 oz (63 kg)    Physical Exam Vitals and nursing note reviewed.  HENT:     Head: Normocephalic and atraumatic.     Mouth/Throat:     Pharynx: Oropharynx is clear.  Eyes:     Extraocular Movements: Extraocular movements intact.     Pupils: Pupils are equal, round, and reactive to light.  Cardiovascular:     Rate and Rhythm: Normal rate and regular rhythm.  Pulmonary:     Effort: Pulmonary effort is normal.     Breath sounds: Normal breath sounds.  Abdominal:     Palpations: Abdomen is soft.  Musculoskeletal:        General: Normal range of motion.     Cervical back: Normal range of motion.  Skin:    General: Skin is warm.  Neurological:     General: No focal deficit present.     Mental Status: She is alert and oriented to person, place, and time.  Psychiatric:        Behavior: Behavior normal.        Judgment: Judgment normal.        LABORATORY DATA:  I have reviewed the data as listed    Component Value Date/Time   NA 136 10/01/2023 1035   K 4.5 10/01/2023 1035   CL 102 10/01/2023 1035   CO2 27 10/01/2023 1035   GLUCOSE 94 10/01/2023 1035   BUN 17 10/01/2023 1035   CREATININE 0.80 10/01/2023 1035   CALCIUM  9.3 10/01/2023 1035   PROT 7.1 10/01/2023 1035    ALBUMIN 4.4 08/28/2022 0951   AST 24 10/01/2023 1035   ALT 21 10/01/2023 1035   ALKPHOS 65 08/28/2022 0951   BILITOT 1.0 10/01/2023 1035    No results found for: SPEP, UPEP  Lab Results  Component Value Date   WBC 9.3 02/24/2024   NEUTROABS 3.1 02/24/2024   HGB 13.6 02/24/2024   HCT 40.0 02/24/2024   MCV 87.3 02/24/2024   PLT 180 02/24/2024      Chemistry  Component Value Date/Time   NA 136 10/01/2023 1035   K 4.5 10/01/2023 1035   CL 102 10/01/2023 1035   CO2 27 10/01/2023 1035   BUN 17 10/01/2023 1035   CREATININE 0.80 10/01/2023 1035      Component Value Date/Time   CALCIUM  9.3 10/01/2023 1035   ALKPHOS 65 08/28/2022 0951   AST 24 10/01/2023 1035   ALT 21 10/01/2023 1035   BILITOT 1.0 10/01/2023 1035       RADIOGRAPHIC STUDIES: I have personally reviewed the radiological images as listed and agreed with the findings in the report. No results found.   ASSESSMENT & PLAN:  Lymphocytosis    Monoclonal B-cell lymphocytosis/CLL Slightly elevated lymphocytes at 5.6, suggestive of monoclonal B-cell lymphocytosis. Flow cytometry indicates CD5, CD2 positive monoclonal B cell population, suggestive of early-stage CLL. Risk of progression to CLL is approximately 20% over 20 years. No current leukemia symptoms.  Emphasized monitoring for symptoms and maintaining vaccinations. - Continue annual blood count monitoring with primary care physician. - Maintain up-to-date vaccinations. - Monitor for leukemia symptoms: lymphadenopathy, weight loss, night sweats, fatigue. - Follow up with primary care physician for routine blood work and monitoring. - Return to hematology if significant changes in blood counts or symptoms occur.      #Since patient is clinically stable I think is reasonable for the patient to follow-up with PCP/can follow-up with us  as needed.  Patient comfortable with the plan; to call us  if any questions or concerns in the interim.   # DISPOSITION: #  follow up as needed-   Dr.B    No orders of the defined types were placed in this encounter.  All questions were answered. The patient knows to call the clinic with any problems, questions or concerns.      Cindy JONELLE Joe, MD 03/02/2024 11:20 AM

## 2024-03-02 NOTE — Assessment & Plan Note (Addendum)
  Monoclonal B-cell lymphocytosis/CLL Slightly elevated lymphocytes at 5.6, suggestive of monoclonal B-cell lymphocytosis. Flow cytometry indicates CD5, CD2 positive monoclonal B cell population, suggestive of early-stage CLL. Risk of progression to CLL is approximately 20% over 20 years. No current leukemia symptoms.  Emphasized monitoring for symptoms and maintaining vaccinations. - Continue annual blood count monitoring with primary care physician. - Maintain up-to-date vaccinations. - Monitor for leukemia symptoms: lymphadenopathy, weight loss, night sweats, fatigue. - Follow up with primary care physician for routine blood work and monitoring. - Return to hematology if significant changes in blood counts or symptoms occur.      #Since patient is clinically stable I think is reasonable for the patient to follow-up with PCP/can follow-up with us  as needed.  Patient comfortable with the plan; to call us  if any questions or concerns in the interim.   # DISPOSITION: # follow up as needed-   Dr.B

## 2024-03-10 NOTE — Telephone Encounter (Signed)
 open in error

## 2024-04-29 ENCOUNTER — Other Ambulatory Visit: Payer: Self-pay | Admitting: Family

## 2024-04-29 DIAGNOSIS — Z1231 Encounter for screening mammogram for malignant neoplasm of breast: Secondary | ICD-10-CM

## 2024-06-01 ENCOUNTER — Other Ambulatory Visit

## 2024-06-01 ENCOUNTER — Encounter

## 2024-10-08 ENCOUNTER — Encounter: Admitting: Family
